# Patient Record
Sex: Male | Born: 1965 | Race: Black or African American | Hispanic: No | Marital: Married | Smoking: Former smoker
Health system: Southern US, Community
[De-identification: ages and names within clinical notes are randomized; demographics above are authoritative.]

## PROBLEM LIST (undated history)

## (undated) DIAGNOSIS — U071 COVID-19: Secondary | ICD-10-CM

## (undated) DIAGNOSIS — I10 Essential (primary) hypertension: Secondary | ICD-10-CM

## (undated) HISTORY — PX: ABDOMINAL SURGERY: SHX537

---

## 2007-07-20 ENCOUNTER — Encounter (INDEPENDENT_AMBULATORY_CARE_PROVIDER_SITE_OTHER): Payer: Self-pay | Admitting: General Surgery

## 2007-07-20 ENCOUNTER — Inpatient Hospital Stay (HOSPITAL_COMMUNITY): Admission: EM | Admit: 2007-07-20 | Discharge: 2007-07-21 | Payer: Self-pay | Admitting: Emergency Medicine

## 2007-08-12 ENCOUNTER — Emergency Department (HOSPITAL_COMMUNITY): Admission: EM | Admit: 2007-08-12 | Discharge: 2007-08-12 | Payer: Self-pay | Admitting: Emergency Medicine

## 2007-08-14 ENCOUNTER — Emergency Department (HOSPITAL_COMMUNITY): Admission: EM | Admit: 2007-08-14 | Discharge: 2007-08-14 | Payer: Self-pay | Admitting: Emergency Medicine

## 2010-07-19 NOTE — H&P (Signed)
NAMEDaron Offer                ACCOUNT NO.:  [id number]   MEDICAL RECORD NO.:  [id number]          PATIENT TYPE:  EMS   LOCATION:  MAJO                         FACILITY:  [facility]   PHYSICIAN:  Lennie Muckle, MD      DATE OF BIRTH:  08-Jan-1966   DATE OF ADMISSION:  07/20/2007  DATE OF DISCHARGE:                              HISTORY & PHYSICAL   REASON FOR ADMISSION:  Appendicitis.   HISTORY OF PRESENT ILLNESS:  [name] is a 45 year old male who had  onset of abdominal pain this morning.  It was located in the umbilical  region as well as the right lower quadrant.  It did worsen with  movement.  He did not have associated nausea and emesis.  No chills.  No  diarrhea.  He had episodes of diarrhea previously but treated  aggressively with medical therapy.  A CT scan was performed, which  revealed a thick and dense, small amount of fluid in the pelvis.   PAST MEDICAL HISTORY:  He has no past medical history.   SURGICAL HISTORY:  Negative.   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  No alcohol use.   FAMILY HISTORY:  Negative.   ALLERGIES:  No drug allergies.   REVIEW OF SYSTEMS:  Negative.   PHYSICAL EXAMINATION:  VITAL SIGNS:  Temperature is 96.3, pulse is 63,  and blood pressure 101/56.  GENERAL:  He overall is a well-developed, well-nourished male in no  acute distress, lying on stretcher.  HEENT:  Extraocular muscles are intact. Oral mucosa is moist.  CHEST: Clear to auscultation bilaterally.  CARDIOVASCULAR: Regular rate and rhythm.  ABDOMEN:  Mildly distended to palpation of the right lower quadrant.  Positive Rovsing sound.  He does have umbilical hernia, easily  reducible.  EXTREMITIES:  No deformity or edema.  SKIN:  Normal limits.  NEUROLOGICAL:  Cranial nerves II through XII are grossly intact.  No  focal deficits.   LABORATORY DATA:  White count is slightly elevated at 12.5 and  hemoglobin/hematocrit 15 and  45.  Serum chemistry, sodium is mildly low  at 3.4.  Other labs are essentially normal.   ASSESSMENT/PLAN:  Appendicitis.   Plan is laparoscopic appendectomy with IV antibiotics.  If everything  goes well, and there is no purulent fluid noted, he will be likely be  able to go home tomorrow.      Lennie Muckle, MD  Electronically Signed     ALA/MEDQ  D:  07/20/2007  T:  07/21/2007  Job:  [id number]

## 2010-07-19 NOTE — Op Note (Signed)
NAMEDaron Lawrence                ACCOUNT NO.:  [id number]   MEDICAL RECORD NO.:  [id number]          PATIENT TYPE:  INP   LOCATION:  5123                         FACILITY:  [facility]   PHYSICIAN:  Lennie Muckle, MD      DATE OF BIRTH:  1965-05-07   DATE OF PROCEDURE:  07/20/2007  DATE OF DISCHARGE:                               OPERATIVE REPORT   PREOPERATIVE DIAGNOSIS:  Appendicitis.   POSTOPERATIVE DIAGNOSIS:  Appendicitis.   PROCEDURE:  Laparoscopic appendectomy.   SURGEON:  Lennie Muckle, MD   ASSISTANT:  None.   ANESTHESIA:  General endotracheal.   FINDINGS:  Small amount of purulent fluid in the pelvis with  appendicitis.  Appendix was sent to pathology for review.   ESTIMATED BLOOD LOSS:  Minimal.   COMPLICATIONS:  No.   DRAINS:  None.   INDICATIONS FOR PROCEDURE:  [name] is a 45 year old male who had onset  of abdominal pain earlier in the morning who came into the emergency  department due to emesis and abdominal pain.  CT scan showed an enlarged  inflamed appendix.  Informed consent was obtained for laparoscopic  appendectomy, possible open.   DETAIL OF THE PROCEDURE:  [name] was treated with Zosyn  preoperatively.  He was taken to the operating room, placed in supine  position.  After administration of the general endotracheal anesthesia,  his abdomen was prepped and draped in the usual sterile fashion.  Time  out procedure indicating the patient had procedure performed.  An  umbilical incision was placed in #11 blade in the hernia site.  A 5-mm  trocar was placed in OptiView directly into the abdominal cavity.  No  evidence of injury upon placement of the trocar.  Another 5-mm trocar  was placed in the right upper quadrant.  A 12-mm trocar was placed in  the left lower quadrant.  There was a minimal amount of purulent fluid  noted in the pelvis.  I was able to grasp the cecum in the right lower  quadrant, dissect laterally the attachments of the side  walls with  scissors as well as the Mailler forceps and harmonic scalpel.  Dissected  fully around the base of the appendix.  I did mobilize part of the cecum  and the small intestine in order to able to fully mobilize the appendix.  A laparoscopic stapler was placed across the base of appendix using the  blue load.  The mesoappendix was divided with the harmonic scalpel.  The  appendix was placed in the Endocatch bag and removed from the abdomen.  The abdomen was irrigated with approximately 2 liters of saline.  Aspirate appeared clear on final irrigation. There was a minimal amount  of oozing at the staple line which seemed to have stopped at last look.  I elected to place a layer of Tisseel over the staple line.  After  closing the 12-mm port site with 0-Vicryl suture, I turned my attention  to the umbilical site.  The incision was enlarged with the scalpel.  I  created skin flaps using the electrocautery.  The fascia was also  dissected with the electrocautery in order to facilitate closure. I  closed this in an interrupted fashion using 1-0 Prolene suture.  The  closure was inspected intra-abdominally with no adhesions to the  closure.  The pneumoperitoneum was then released.  The skin was closed  with 4-0 Monocryl, and Dermabond was placed with final dressing.  The  patient has been given a local block in the operating room by  anesthesia.  He was then extubated and transported to the postanesthesia  care unit in stable condition.  He will be monitored overnight, given 2  doses of Zosyn and then likely be able to be discharged home tomorrow.      Lennie Muckle, MD  Electronically Signed     ALA/MEDQ  D:  07/20/2007  T:  07/21/2007  Job:  [id number]

## 2010-11-08 ENCOUNTER — Emergency Department (HOSPITAL_COMMUNITY)
Admission: EM | Admit: 2010-11-08 | Discharge: 2010-11-08 | Disposition: A | Discharge status: Home / Self Care | Payer: BC Managed Care – PPO | Attending: Emergency Medicine | Admitting: Emergency Medicine

## 2010-11-08 DIAGNOSIS — X58XXXA Exposure to other specified factors, initial encounter: Secondary | ICD-10-CM | POA: Insufficient documentation

## 2010-11-08 DIAGNOSIS — S058X9A Other injuries of unspecified eye and orbit, initial encounter: Secondary | ICD-10-CM | POA: Insufficient documentation

## 2010-11-30 LAB — DIFFERENTIAL
Basophils Absolute: 0.1
Lymphocytes Relative: 6 — ABNORMAL LOW
Neutro Abs: 11.2 — ABNORMAL HIGH

## 2010-11-30 LAB — URINALYSIS, ROUTINE W REFLEX MICROSCOPIC
Glucose, UA: NEGATIVE
Hgb urine dipstick: NEGATIVE
Protein, ur: NEGATIVE

## 2010-11-30 LAB — COMPREHENSIVE METABOLIC PANEL
BUN: 10
CO2: 27
Chloride: 104
Creatinine, Ser: 0.99
GFR calc non Af Amer: >60
Total Bilirubin: 1.7 — ABNORMAL HIGH

## 2010-11-30 LAB — CBC
HCT: 45.9
MCV: 84.4
RBC: 5.43
WBC: 12.5 — ABNORMAL HIGH

## 2010-11-30 LAB — LIPASE, BLOOD: Lipase: 15

## 2011-05-02 ENCOUNTER — Emergency Department (HOSPITAL_COMMUNITY): Payer: BC Managed Care – PPO

## 2011-05-02 ENCOUNTER — Encounter (HOSPITAL_COMMUNITY): Payer: Self-pay | Admitting: *Deleted

## 2011-05-02 ENCOUNTER — Emergency Department (HOSPITAL_COMMUNITY)
Admission: EM | Admit: 2011-05-02 | Discharge: 2011-05-02 | Disposition: A | Discharge status: Home / Self Care | Payer: BC Managed Care – PPO | Attending: Emergency Medicine | Admitting: Emergency Medicine

## 2011-05-02 DIAGNOSIS — S0990XA Unspecified injury of head, initial encounter: Secondary | ICD-10-CM | POA: Insufficient documentation

## 2011-05-02 DIAGNOSIS — W208XXA Other cause of strike by thrown, projected or falling object, initial encounter: Secondary | ICD-10-CM | POA: Insufficient documentation

## 2011-05-02 DIAGNOSIS — M542 Cervicalgia: Secondary | ICD-10-CM | POA: Insufficient documentation

## 2011-05-02 DIAGNOSIS — Y9289 Other specified places as the place of occurrence of the external cause: Secondary | ICD-10-CM | POA: Insufficient documentation

## 2011-05-02 DIAGNOSIS — S0003XA Contusion of scalp, initial encounter: Secondary | ICD-10-CM | POA: Insufficient documentation

## 2011-05-02 DIAGNOSIS — S1093XA Contusion of unspecified part of neck, initial encounter: Secondary | ICD-10-CM | POA: Insufficient documentation

## 2011-05-02 MED ORDER — TETANUS-DIPHTH-ACELL PERTUSSIS 5-2.5-18.5 LF-MCG/0.5 IM SUSP
0.5000 mL | Freq: Once | INTRAMUSCULAR | Status: AC
  Administered 2011-05-02: 0.5 mL via INTRAMUSCULAR
  Filled 2011-05-02: qty 0.5

## 2011-05-02 MED ORDER — ONDANSETRON 4 MG PO TBDP
8.0000 mg | ORAL_TABLET | Freq: Once | ORAL | Status: AC
  Administered 2011-05-02: 8 mg via ORAL
  Filled 2011-05-02: qty 2

## 2011-05-02 MED ORDER — IBUPROFEN 800 MG PO TABS: 800.0000 mg | ORAL_TABLET | Freq: Three times a day (TID) | ORAL | Status: AC

## 2011-05-02 NOTE — ED Provider Notes (Signed)
History     CSN: [id number]  Arrival date & time 05/02/11  0124   First MD Initiated Contact with Patient 05/02/11 [phone]      Chief Complaint  Patient presents with  . Head Injury    (Consider location/radiation/quality/duration/timing/severity/associated sxs/prior treatment) Patient is a 46 y.o. male presenting with head injury. The history is provided by the patient.  Head Injury  The incident occurred less than 1 hour ago. He came to the ER via walk-in. The injury mechanism was a direct blow. There was no loss of consciousness. The volume of blood lost was minimal. The quality of the pain is described as sharp. The pain is moderate. The pain has been constant since the injury. Pertinent negatives include no numbness, no blurred vision, no vomiting, no tinnitus, no disorientation, no weakness and no memory loss. He has tried nothing for the symptoms.   at work tonight and a heavy object fell on top of his head causing pain and swelling and a small abrasion. Location left superior posterior scalp. He has some associated mild neck discomfort. He denies any weakness or numbness or injury otherwise. He has had some nausea but no vomiting. He declines any pain medications.  History reviewed. No pertinent past medical history.  Past Surgical History  Procedure Date  . Abdominal surgery     No family history on file.  History  Substance Use Topics  . Smoking status: Never Smoker   . Smokeless tobacco: Not on file  . Alcohol Use: No      Review of Systems  Constitutional: Negative for fever and chills.  HENT: Negative for neck pain, neck stiffness and tinnitus.   Eyes: Negative for blurred vision and pain.  Respiratory: Negative for shortness of breath.   Cardiovascular: Negative for chest pain.  Gastrointestinal: Negative for vomiting and abdominal pain.  Genitourinary: Negative for dysuria.  Musculoskeletal: Negative for back pain.  Skin: Positive for wound. Negative for  rash.  Neurological: Positive for headaches. Negative for weakness and numbness.  Psychiatric/Behavioral: Negative for memory loss.  All other systems reviewed and are negative.    Allergies  Review of patient's allergies indicates no known allergies.  Home Medications   Current Outpatient Rx  Name Route Sig Dispense Refill  . ACETAMINOPHEN 500 MG PO TABS Oral Take 1,000 mg by mouth every 6 (six) hours as needed. For pain or fever      BP 111/77  Pulse 71  Temp(Src) 98.1 F (36.7 C) (Oral)  Resp 16  SpO2 98%  Physical Exam  Constitutional: He is oriented to person, place, and time. He appears well-developed and well-nourished.  HENT:  Head: Normocephalic.       Left superior posterior scalp with abrasion, tenderness and area of swelling. No deep laceration. Bleeding controlled.  Eyes: Conjunctivae and EOM are normal. Pupils are equal, round, and reactive to light.  Neck: Trachea normal. Neck supple. No thyromegaly present.       Very mild cervical discomfort without bony deformity or step off. No associated upper extremity deficits with equal grip strengths and sensorium to light touch throughout.  Cardiovascular: Normal rate, regular rhythm, S1 normal, S2 normal and normal pulses.     No systolic murmur is present   No diastolic murmur is present  Pulses:      Radial pulses are 2+ on the right side, and 2+ on the left side.  Pulmonary/Chest: Effort normal and breath sounds normal. He has no wheezes. He has no rhonchi.  He has no rales. He exhibits no tenderness.  Abdominal: Soft. Normal appearance and bowel sounds are normal. There is no tenderness. There is no CVA tenderness and negative Murphy's sign.  Musculoskeletal:       No motor or sensory deficits  Neurological: He is alert and oriented to person, place, and time. He has normal strength. No cranial nerve deficit or sensory deficit. GCS eye subscore is 4. GCS verbal subscore is 5. GCS motor subscore is 6.  Skin: Skin  is warm and dry. No rash noted. He is not diaphoretic.  Psychiatric: His speech is normal.       Cooperative and appropriate    ED Course  Procedures (including critical care time)  Labs Reviewed - No data to display Ct Head Wo Contrast  05/02/2011  *RADIOLOGY REPORT*  Clinical Data:  Status post head injury; laceration to the top of the head on the left side.  Head and neck pain.  CT HEAD WITHOUT CONTRAST AND CT CERVICAL SPINE WITHOUT CONTRAST  Technique:  Multidetector CT imaging of the head and cervical spine was performed following the standard protocol without intravenous contrast.  Multiplanar CT image reconstructions of the cervical spine were also generated.  Comparison: None  CT HEAD  Findings: There is no evidence of acute infarction, mass lesion, or intra- or extra-axial hemorrhage on CT.  The posterior fossa, including the cerebellum, brainstem and fourth ventricle, is within normal limits.  The third and lateral ventricles, and basal ganglia are unremarkable in appearance.  The cerebral hemispheres are symmetric in appearance, with normal gray- white differentiation.  No mass effect or midline shift is seen.  There is no evidence of fracture; visualized osseous structures are unremarkable in appearance.  The visualized portions of the orbits are within normal limits.  There is mild partial opacification of the left maxillary sinus.  The remaining paranasal sinuses and mastoid air cells are well-aerated.  No significant soft tissue abnormalities are seen; the known soft tissue laceration is not well characterized.  IMPRESSION:  1.  No evidence of traumatic intracranial injury or fracture. 2.  Mild partial opacification of the left maxillary sinus.  CT CERVICAL SPINE  Findings: There is no evidence of fracture or subluxation. Vertebral bodies demonstrate normal height and alignment. Intervertebral disc spaces are preserved.  Prevertebral soft tissues are within normal limits.  The visualized  neural foramina are grossly unremarkable.  The thyroid gland is unremarkable in appearance.  The visualized lung apices are clear.  No significant soft tissue abnormalities are seen.  IMPRESSION: No evidence of fracture or subluxation along the cervical spine.  Original Report Authenticated By: Tonia Ghent, M.D.   Ct Cervical Spine Wo Contrast  05/02/2011  *RADIOLOGY REPORT*  Clinical Data:  Status post head injury; laceration to the top of the head on the left side.  Head and neck pain.  CT HEAD WITHOUT CONTRAST AND CT CERVICAL SPINE WITHOUT CONTRAST  Technique:  Multidetector CT imaging of the head and cervical spine was performed following the standard protocol without intravenous contrast.  Multiplanar CT image reconstructions of the cervical spine were also generated.  Comparison: None  CT HEAD  Findings: There is no evidence of acute infarction, mass lesion, or intra- or extra-axial hemorrhage on CT.  The posterior fossa, including the cerebellum, brainstem and fourth ventricle, is within normal limits.  The third and lateral ventricles, and basal ganglia are unremarkable in appearance.  The cerebral hemispheres are symmetric in appearance, with normal gray- white differentiation.  No mass effect or midline shift is seen.  There is no evidence of fracture; visualized osseous structures are unremarkable in appearance.  The visualized portions of the orbits are within normal limits.  There is mild partial opacification of the left maxillary sinus.  The remaining paranasal sinuses and mastoid air cells are well-aerated.  No significant soft tissue abnormalities are seen; the known soft tissue laceration is not well characterized.  IMPRESSION:  1.  No evidence of traumatic intracranial injury or fracture. 2.  Mild partial opacification of the left maxillary sinus.  CT CERVICAL SPINE  Findings: There is no evidence of fracture or subluxation. Vertebral bodies demonstrate normal height and alignment.  Intervertebral disc spaces are preserved.  Prevertebral soft tissues are within normal limits.  The visualized neural foramina are grossly unremarkable.  The thyroid gland is unremarkable in appearance.  The visualized lung apices are clear.  No significant soft tissue abnormalities are seen.  IMPRESSION: No evidence of fracture or subluxation along the cervical spine.  Original Report Authenticated By: Tonia Ghent, M.D.    Scalp contusion and abrasion  Wound care, tetanus updated, Zofran for nausea improves symptoms. Patient declines pain meds  CT scans change and reviewed as above.    MDM   Head injury with abrasion and contusion. Wound is not require sutures or staples. Occupational health referral as needed. Patient cleared to return to work.        Sunnie Nielsen, MD 05/02/11 [phone]

## 2011-05-02 NOTE — Discharge Instructions (Signed)
Apply Neosporin ointment to scalp wound twice daily for the next 5 days. Take ibuprofen as needed. 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  Followup with Worker's Comp. as needed and return here for any worsening condition.   A bruise (contusion) or hematoma is a collection of blood under skin causing an area of discoloration. It is caused by an injury to blood vessels beneath the injured area with a release of blood into that area. As blood accumulates it is known as a hematoma. This collection of blood causes a blue to dark blue color. As the injury improves over days to weeks it turns to a yellowish color and then usually disappears completely over the same period of time. These generally resolve completely without problems. The hematoma rarely requires drainage. HOME CARE INSTRUCTIONS   Apply ice to the injured area for 15 to 20 minutes 3 to 4 times per day for the first 1 or 2 days.   Put the ice in a plastic bag and place a towel between the bag of ice and your skin. Discontinue the ice if it causes pain.   If bleeding is more than just a little, apply pressure to the area for at least thirty minutes to decrease the amount of bruising. Apply pressure and ice as your caregiver suggests.   If the injury is on an extremity, elevation of that part may help to decrease pain and swelling. Wrapping with an ace or supportive wrap may also be helpful. If the bruise is on a lower extremity and is painful, crutches may be helpful for a couple days.   If you have been given a tetanus shot because the skin was broken, your arm may get swollen, red and warm to touch at the shot site. This is a normal response to the medicine in the shot. If you did not receive a tetanus shot today because you did not recall when your last one was given, make sure to check with your caregiver's office and determine if one is needed. Generally for a "dirty" wound, you should receive a tetanus booster if you have not had one in the last  five years. If you have a "clean" wound, you should receive a tetanus booster if you have not had one within the last ten years.  SEEK MEDICAL CARE IF:   You have pain not controlled with over the counter medications. Only take over-the-counter or prescription medicines for pain, discomfort, or fever as directed by your caregiver. Do not use aspirin as it may cause bleeding.   You develop increasing pain or swelling in the area of injury.   You develop any problems which seem worse than the problems which brought you in.  SEEK IMMEDIATE MEDICAL CARE IF:   You have a fever.   You develop severe pain in the area of the bruise out of proportion to the initial injury.   The bruised area becomes red, tender, and swollen.  MAKE SURE YOU:   Understand these instructions.   Will watch your condition.   Will get help right away if you are not doing well or get worse.  Document Released: 11/30/2004 Document Revised: 11/02/2010 Document Reviewed: 10/09/2007 [facility] Patient Information 2012 [place], [place].

## 2011-05-02 NOTE — ED Notes (Addendum)
Here after head injury at work, here from work, hit in head with metal around ~ 2215, puncture/lac noted to L parietal head, ~ 0.5 cm, bleeding controlled, "felt dizzy and nauseated initially", c/o HA & "stomach bothering me/upset since incident". Denies LOC or vomiting.

## 2011-05-02 NOTE — ED Notes (Signed)
Cleansed wound to left upper head with wound cleaner and applied bactracin onitment

## 2012-02-13 ENCOUNTER — Emergency Department (HOSPITAL_COMMUNITY)
Admission: EM | Admit: 2012-02-13 | Discharge: 2012-02-13 | Disposition: A | Discharge status: Home / Self Care | Payer: BC Managed Care – PPO | Attending: Emergency Medicine | Admitting: Emergency Medicine

## 2012-02-13 ENCOUNTER — Emergency Department (HOSPITAL_COMMUNITY): Payer: BC Managed Care – PPO

## 2012-02-13 ENCOUNTER — Encounter (HOSPITAL_COMMUNITY): Payer: Self-pay | Admitting: *Deleted

## 2012-02-13 DIAGNOSIS — R05 Cough: Secondary | ICD-10-CM | POA: Insufficient documentation

## 2012-02-13 DIAGNOSIS — J159 Unspecified bacterial pneumonia: Secondary | ICD-10-CM | POA: Insufficient documentation

## 2012-02-13 DIAGNOSIS — R059 Cough, unspecified: Secondary | ICD-10-CM | POA: Insufficient documentation

## 2012-02-13 DIAGNOSIS — J189 Pneumonia, unspecified organism: Secondary | ICD-10-CM

## 2012-02-13 DIAGNOSIS — R0789 Other chest pain: Secondary | ICD-10-CM | POA: Insufficient documentation

## 2012-02-13 DIAGNOSIS — R110 Nausea: Secondary | ICD-10-CM | POA: Insufficient documentation

## 2012-02-13 DIAGNOSIS — J3489 Other specified disorders of nose and nasal sinuses: Secondary | ICD-10-CM | POA: Insufficient documentation

## 2012-02-13 LAB — CBC WITH DIFFERENTIAL/PLATELET
Basophils Relative: 0 % (ref 0–1)
Hemoglobin: 14.7 g/dL (ref 13.0–17.0)
Lymphs Abs: 0.9 10*3/uL (ref 0.7–4.0)
MCHC: 35.5 g/dL (ref 30.0–36.0)
Monocytes Relative: 11 % (ref 3–12)
Neutro Abs: 6.6 10*3/uL (ref 1.7–7.7)
Neutrophils Relative %: 78 % — ABNORMAL HIGH (ref 43–77)
RBC: 5.16 MIL/uL (ref 4.22–5.81)

## 2012-02-13 LAB — COMPREHENSIVE METABOLIC PANEL
ALT: 80 U/L — ABNORMAL HIGH (ref 0–53)
AST: 52 U/L — ABNORMAL HIGH (ref 0–37)
Albumin: 3.9 g/dL (ref 3.5–5.2)
Alkaline Phosphatase: 69 U/L (ref 39–117)
BUN: 13 mg/dL (ref 6–23)
CO2: 23 mEq/L (ref 19–32)
Calcium: 9.2 mg/dL (ref 8.4–10.5)
Chloride: 95 mEq/L — ABNORMAL LOW (ref 96–112)
Creatinine, Ser: 0.87 mg/dL (ref 0.50–1.35)
GFR calc Af Amer: >90 mL/min (ref >90)
GFR calc non Af Amer: >90 mL/min (ref >90)
Glucose, Bld: 110 mg/dL — ABNORMAL HIGH (ref 70–99)
Potassium: 3.6 mEq/L (ref 3.5–5.1)
Sodium: 131 mEq/L — ABNORMAL LOW (ref 135–145)
Total Bilirubin: 0.9 mg/dL (ref 0.3–1.2)
Total Protein: 8.1 g/dL (ref 6.0–8.3)

## 2012-02-13 LAB — LIPASE, BLOOD: Lipase: 131 U/L — ABNORMAL HIGH (ref 11–59)

## 2012-02-13 MED ORDER — IOHEXOL 300 MG/ML  SOLN
100.0000 mL | Freq: Once | INTRAMUSCULAR | Status: AC | PRN
  Administered 2012-02-13: 100 mL via INTRAVENOUS

## 2012-02-13 MED ORDER — ACETAMINOPHEN 325 MG PO TABS
650.0000 mg | ORAL_TABLET | Freq: Once | ORAL | Status: AC
  Administered 2012-02-13: 650 mg via ORAL
  Filled 2012-02-13: qty 2

## 2012-02-13 MED ORDER — MORPHINE SULFATE 4 MG/ML IJ SOLN
4.0000 mg | Freq: Once | INTRAMUSCULAR | Status: AC
  Administered 2012-02-13: 4 mg via INTRAVENOUS
  Filled 2012-02-13: qty 1

## 2012-02-13 MED ORDER — ACETAMINOPHEN 325 MG PO TABS
ORAL_TABLET | ORAL | Status: AC
  Filled 2012-02-13: qty 2

## 2012-02-13 MED ORDER — DIPHENHYDRAMINE HCL 50 MG/ML IJ SOLN
25.0000 mg | Freq: Once | INTRAMUSCULAR | Status: AC
  Administered 2012-02-13: 15:00:00 via INTRAVENOUS
  Filled 2012-02-13: qty 1

## 2012-02-13 MED ORDER — LEVOFLOXACIN 500 MG PO TABS: 750.0000 mg | ORAL_TABLET | Freq: Every day | ORAL | Status: DC

## 2012-02-13 MED ORDER — METOCLOPRAMIDE HCL 5 MG/ML IJ SOLN
10.0000 mg | Freq: Once | INTRAMUSCULAR | Status: AC
  Administered 2012-02-13: 10 mg via INTRAVENOUS
  Filled 2012-02-13: qty 2

## 2012-02-13 MED ORDER — SODIUM CHLORIDE 0.9 % IV BOLUS (SEPSIS)
1000.0000 mL | Freq: Once | INTRAVENOUS | Status: AC
  Administered 2012-02-13: 1000 mL via INTRAVENOUS

## 2012-02-13 MED ORDER — ONDANSETRON 4 MG PO TBDP: 4.0000 mg | ORAL_TABLET | Freq: Three times a day (TID) | ORAL | Status: DC | PRN

## 2012-02-13 NOTE — ED Notes (Signed)
Pt c/o flu like symptoms, reports started on yesterday. Reports abd pain and nausea since yesterday. Also reports chest pain sharp in nature that began on yesterday. Pt also has cough.

## 2012-02-13 NOTE — ED Notes (Signed)
Bed:WA10<BR> Expected date:<BR> Expected time:<BR> Means of arrival:<BR> Comments:<BR> Hold for triage 4

## 2012-02-13 NOTE — ED Provider Notes (Signed)
History     CSN: [id number]  Arrival date & time 02/13/12  1154   First MD Initiated Contact with Patient 02/13/12 1342      Chief Complaint  Patient presents with  . Abdominal Pain  . Chest Pain    (Consider location/radiation/quality/duration/timing/severity/associated sxs/prior treatment) HPI Comments: Patient is a 46 year old male who presents with a cough and upper respiratory symptoms that started yesterday. Patient reports gradual onset of symptoms with progressive worsening. Patient reports dry cough, nasal congestion, headache, and nausea. Patient reports associated sharp chest pain when he coughs. The chest pain is located in his generalized chest without radiation. Patient has tried tylenol for symptoms without relief. Patient denies alleviating/aggravating factors. Patient denies any other symptoms.    History reviewed. No pertinent past medical history.  Past Surgical History  Procedure Date  . Abdominal surgery     History reviewed. No pertinent family history.  History  Substance Use Topics  . Smoking status: Never Smoker   . Smokeless tobacco: Not on file  . Alcohol Use: No      Review of Systems  Respiratory: Positive for cough.   Cardiovascular: Positive for chest pain.  Gastrointestinal: Positive for nausea and abdominal pain.  All other systems reviewed and are negative.    Allergies  Review of patient's allergies indicates no known allergies.  Home Medications   Current Outpatient Rx  Name  Route  Sig  Dispense  Refill  . ACETAMINOPHEN 500 MG PO TABS   Oral   Take 1,000 mg by mouth every 6 (six) hours as needed. For pain or fever           BP 147/88  Pulse 100  Temp 100 F (37.8 C) (Oral)  Resp 22  Wt 242 lb 12.8 oz (110.133 kg)  SpO2 95%  Physical Exam  Nursing note and vitals reviewed. Constitutional: He is oriented to person, place, and time. He appears well-developed and well-nourished. No distress.  HENT:  Head:  Normocephalic and atraumatic.  Mouth/Throat: Oropharynx is clear and moist. No oropharyngeal exudate.  Eyes: Conjunctivae normal and EOM are normal.  Neck: Normal range of motion. Neck supple.  Cardiovascular: Normal rate and regular rhythm.  Exam reveals no gallop and no friction rub.   No murmur heard. Pulmonary/Chest: Effort normal and breath sounds normal. He has no wheezes. He has no rales. He exhibits no tenderness.  Abdominal: Soft. He exhibits no distension. There is tenderness. There is no rebound and no guarding.       Generalized tenderness to palpation. No focal area of tenderness and no suprapubic tenderness.   Musculoskeletal: Normal range of motion.  Neurological: He is alert and oriented to person, place, and time. Coordination normal.       Speech is goal-oriented. Moves limbs without ataxia.   Skin: Skin is warm and dry. He is not diaphoretic.  Psychiatric: He has a normal mood and affect. His behavior is normal.    ED Course  Procedures (including critical care time)  Labs Reviewed  CBC WITH DIFFERENTIAL - Abnormal; Notable for the following:    Neutrophils Relative 78 (*)     Lymphocytes Relative 11 (*)     All other components within normal limits  COMPREHENSIVE METABOLIC PANEL - Abnormal; Notable for the following:    Sodium 131 (*)     Chloride 95 (*)     Glucose, Bld 110 (*)     AST 52 (*)     ALT 80 (*)  All other components within normal limits  LIPASE, BLOOD - Abnormal; Notable for the following:    Lipase 131 (*)     All other components within normal limits  TROPONIN I   Dg Chest 2 View  02/13/2012  *RADIOLOGY REPORT*  Clinical Data: Cough, chest pain  CHEST - 2 VIEW  Comparison: None.  Findings:  Normal cardiac silhouette and mediastinal contours.  There is diffuse, slightly nodular wall thickening of the pulmonary interstitium.  Minimal left basilar opacities favored to represent atelectasis.  There is mild elevation of the right hemidiaphragm.  No pleural effusion or pneumothorax. No acute osseous abnormalities.  IMPRESSION: Mild diffuse, slightly nodular thickening of the pulmonary interstitium most suggestive of airways disease, though note, atypical infection may have a similar appearance.  A follow-up chest radiograph in 4 to 6 weeks after treatment is recommended to ensure resolution.   Original Report Authenticated By: Tacey Ruiz, MD      1. CAP (community acquired pneumonia)       MDM  3:53 PM Patient's chest xray concerning for pneumonia. Labs show elevated lipase and liver enzymes. Patient having generalized abdominal tenderness. He will have CT abdomen pelvis and morphine for pain.   6:24 PM CT unremarkable. Patient will be treated for CAP. I will discharge him with levaquin. Patient is afebrile and non toxic. Patient instructed to return with worsening or concerning symptoms. No further evaluation needed at this time.      Emilia Beck, PA-C 02/13/12 1850

## 2012-02-14 NOTE — ED Provider Notes (Signed)
Medical screening examination/treatment/procedure(s) were performed by non-physician practitioner and as supervising physician I was immediately available for consultation/collaboration.  Marwan T Powers, MD 02/14/12 0719 

## 2013-01-06 ENCOUNTER — Emergency Department (HOSPITAL_COMMUNITY)
Admission: EM | Admit: 2013-01-06 | Discharge: 2013-01-07 | Disposition: A | Discharge status: Home / Self Care | Payer: BC Managed Care – PPO | Attending: Emergency Medicine | Admitting: Emergency Medicine

## 2013-01-06 DIAGNOSIS — Z79899 Other long term (current) drug therapy: Secondary | ICD-10-CM | POA: Insufficient documentation

## 2013-01-06 DIAGNOSIS — J069 Acute upper respiratory infection, unspecified: Secondary | ICD-10-CM | POA: Insufficient documentation

## 2013-01-06 DIAGNOSIS — R51 Headache: Secondary | ICD-10-CM | POA: Insufficient documentation

## 2013-01-06 DIAGNOSIS — R05 Cough: Secondary | ICD-10-CM

## 2013-01-06 DIAGNOSIS — Z8701 Personal history of pneumonia (recurrent): Secondary | ICD-10-CM | POA: Insufficient documentation

## 2013-01-06 DIAGNOSIS — J984 Other disorders of lung: Secondary | ICD-10-CM | POA: Insufficient documentation

## 2013-01-07 ENCOUNTER — Emergency Department (HOSPITAL_COMMUNITY): Payer: BC Managed Care – PPO

## 2013-01-07 ENCOUNTER — Encounter (HOSPITAL_COMMUNITY): Payer: Self-pay | Admitting: Emergency Medicine

## 2013-01-07 MED ORDER — GUAIFENESIN 100 MG/5ML PO SOLN: 5.0000 mL | Freq: Four times a day (QID) | ORAL | Status: DC | PRN

## 2013-01-07 MED ORDER — PSEUDOEPHEDRINE HCL ER 120 MG PO TB12
120.0000 mg | ORAL_TABLET | ORAL | Status: AC
  Administered 2013-01-07: 120 mg via ORAL
  Filled 2013-01-07: qty 1

## 2013-01-07 MED ORDER — PSEUDOEPHEDRINE HCL ER 120 MG PO TB12: 120.0000 mg | ORAL_TABLET | Freq: Two times a day (BID) | ORAL | Status: DC

## 2013-01-07 MED ORDER — GUAIFENESIN 100 MG/5ML PO SOLN
5.0000 mL | ORAL | Status: AC
  Administered 2013-01-07: 100 mg via ORAL
  Filled 2013-01-07: qty 5

## 2013-01-07 NOTE — ED Notes (Signed)
Pt. States that he has had a headache everyday when he wakes up for the last 6 months. He states that the nasal congestion, runny nose, sneezing and coughing started 3 weeks ago. The only med he takes is ibuprofen.

## 2013-01-07 NOTE — ED Notes (Signed)
Pt. reports nasal congestion , runny nose , sneezing with occasional productive cough and persistent headache .

## 2013-01-07 NOTE — ED Provider Notes (Signed)
Medical screening examination/treatment/procedure(s) were performed by non-physician practitioner and as supervising physician I was immediately available for consultation/collaboration.    Adreyan Carbajal M Jaquann Guarisco, MD 01/07/13 0339 

## 2013-01-07 NOTE — ED Provider Notes (Signed)
CSN: [id number]     Arrival date & time 01/06/13  2356 History   First MD Initiated Contact with Patient 01/07/13 0009     Chief Complaint  Patient presents with  . Nasal Congestion  . Cough  . Headache   (Consider location/radiation/quality/duration/timing/severity/associated sxs/prior Treatment) HPI Comments: Patient reports, that for the past 6 months, when he wakes in the morning.  Has a, headache.  That's relieved by ibuprofen for the past 3, weeks.  He's had sinus congestion, runny nose.  Sneezing, coughing, occasionally bringing up clear mucus.  Denies fever, or chills, states he's been taking ibuprofen, without much relief. Has a history of pneumonia, December 2013  Patient is a 47 y.o. male presenting with cough and headaches. The history is provided by the patient.  Cough Cough characteristics:  Non-productive Severity:  Mild Onset quality:  Gradual Duration:  3 weeks Timing:  Intermittent Progression:  Unchanged Chronicity:  New Smoker: no   Relieved by:  None tried Worsened by:  Deep breathing Ineffective treatments:  None tried Associated symptoms: headaches, rhinorrhea and sinus congestion   Associated symptoms: no chest pain, no chills, no ear pain, no eye discharge, no fever, no rash, no shortness of breath, no sore throat and no wheezing   Headaches:    Severity:  Mild   Onset quality:  Unable to specify   Duration:  6 months   Timing:  Intermittent   Progression:  Unchanged   Chronicity:  New Rhinorrhea:    Quality:  Clear   Severity:  Moderate   Duration:  3 weeks   Timing:  Intermittent   Progression:  Unchanged Risk factors: no chemical exposure, no recent infection and no recent travel   Headache Associated symptoms: cough and sinus pressure   Associated symptoms: no dizziness, no ear pain, no fever and no sore throat     History reviewed. No pertinent past medical history. Past Surgical History  Procedure Laterality Date  . Abdominal surgery      No family history on file. History  Substance Use Topics  . Smoking status: Never Smoker   . Smokeless tobacco: Not on file  . Alcohol Use: No    Review of Systems  Constitutional: Negative for fever and chills.  HENT: Positive for rhinorrhea and sinus pressure. Negative for ear pain, facial swelling and sore throat.   Eyes: Negative for discharge.  Respiratory: Positive for cough. Negative for shortness of breath and wheezing.   Cardiovascular: Negative for chest pain.  Skin: Negative for rash and wound.  Neurological: Positive for headaches. Negative for dizziness.    Allergies  Review of patient's allergies indicates no known allergies.  Home Medications   Current Outpatient Rx  Name  Route  Sig  Dispense  Refill  . acetaminophen (TYLENOL) 500 MG tablet   Oral   Take 1,000 mg by mouth every 6 (six) hours as needed. For pain or fever         . guaiFENesin (ROBITUSSIN) 100 MG/5ML SOLN   Oral   Take 5 mLs (100 mg total) by mouth every 6 (six) hours as needed.   1200 mL   0   . levofloxacin (LEVAQUIN) 500 MG tablet   Oral   Take 1.5 tablets (750 mg total) by mouth daily.   8 tablet   0   . ondansetron (ZOFRAN ODT) 4 MG disintegrating tablet   Oral   Take 1 tablet (4 mg total) by mouth every 8 (eight) hours as needed for  nausea.   10 tablet   0   . pseudoephedrine (SUDAFED) 120 MG 12 hr tablet   Oral   Take 1 tablet (120 mg total) by mouth every 12 (twelve) hours.   20 tablet   0    BP 146/85  Pulse 85  Temp(Src) 98.5 F (36.9 C) (Oral)  Resp 18  SpO2 98% Physical Exam  Nursing note and vitals reviewed. Constitutional: He is oriented to person, place, and time. He appears well-developed and well-nourished.  HENT:  Head: Normocephalic and atraumatic.  Right Ear: External ear normal.  Left Ear: External ear normal.  Nose: Right sinus exhibits frontal sinus tenderness. Left sinus exhibits frontal sinus tenderness.  Eyes: Pupils are equal, round,  and reactive to light.  Neck: Normal range of motion.  Cardiovascular: Normal rate and regular rhythm.   Pulmonary/Chest: Breath sounds normal. He is in respiratory distress. He has no wheezes.  Coughing  Musculoskeletal: Normal range of motion.  Lymphadenopathy:    He has no cervical adenopathy.  Neurological: He is alert and oriented to person, place, and time.  Skin: Skin is warm and dry.    ED Course  Procedures (including critical care time) Labs Review Labs Reviewed - No data to display Imaging Review Dg Chest 2 View  01/07/2013   CLINICAL DATA:  Cough, fever and shortness of breath for the past 3 weeks.  EXAM: CHEST  2 VIEW  COMPARISON:  Chest x-ray 02/13/2012.  FINDINGS: Lung volumes are normal. No consolidative airspace disease. No pleural effusions. No pneumothorax. No pulmonary nodule or mass noted. Pulmonary vasculature and the cardiomediastinal silhouette are within normal limits.  IMPRESSION: 1.  No radiographic evidence of acute cardiopulmonary disease.   Electronically Signed   By: Trudie Reed M.D.   On: 01/07/2013 00:59    EKG Interpretation   None       MDM   1. URI (upper respiratory infection)   2. Cough   3. Headache     X-ray, reviewed.  No sign of pneumonia.  Will be discharged home with Robitussin and Sudafed    Arman Filter, NP 01/07/13 0111

## 2014-01-16 ENCOUNTER — Emergency Department (HOSPITAL_COMMUNITY): Payer: BC Managed Care – PPO

## 2014-01-16 ENCOUNTER — Emergency Department (HOSPITAL_COMMUNITY)
Admission: EM | Admit: 2014-01-16 | Discharge: 2014-01-16 | Disposition: A | Discharge status: Home / Self Care | Payer: BC Managed Care – PPO | Attending: Emergency Medicine | Admitting: Emergency Medicine

## 2014-01-16 ENCOUNTER — Encounter (HOSPITAL_COMMUNITY): Payer: Self-pay

## 2014-01-16 DIAGNOSIS — Z79899 Other long term (current) drug therapy: Secondary | ICD-10-CM | POA: Diagnosis not present

## 2014-01-16 DIAGNOSIS — H578 Other specified disorders of eye and adnexa: Secondary | ICD-10-CM | POA: Diagnosis not present

## 2014-01-16 DIAGNOSIS — R6889 Other general symptoms and signs: Secondary | ICD-10-CM

## 2014-01-16 DIAGNOSIS — R0981 Nasal congestion: Secondary | ICD-10-CM

## 2014-01-16 DIAGNOSIS — R079 Chest pain, unspecified: Secondary | ICD-10-CM | POA: Insufficient documentation

## 2014-01-16 DIAGNOSIS — R059 Cough, unspecified: Secondary | ICD-10-CM

## 2014-01-16 DIAGNOSIS — Z792 Long term (current) use of antibiotics: Secondary | ICD-10-CM | POA: Diagnosis not present

## 2014-01-16 DIAGNOSIS — J320 Chronic maxillary sinusitis: Secondary | ICD-10-CM | POA: Insufficient documentation

## 2014-01-16 DIAGNOSIS — R05 Cough: Secondary | ICD-10-CM

## 2014-01-16 LAB — CBC WITH DIFFERENTIAL/PLATELET
BASOS ABS: 0 10*3/uL (ref 0.0–0.1)
Basophils Relative: 1 % (ref 0–1)
EOS PCT: 10 % — AB (ref 0–5)
Eosinophils Absolute: 0.6 10*3/uL (ref 0.0–0.7)
HCT: 41.1 % (ref 39.0–52.0)
Hemoglobin: 14.3 g/dL (ref 13.0–17.0)
LYMPHS PCT: 44 % (ref 12–46)
Lymphs Abs: 2.8 10*3/uL (ref 0.7–4.0)
MCH: 28 pg (ref 26.0–34.0)
MCHC: 34.8 g/dL (ref 30.0–36.0)
MCV: 80.4 fL (ref 78.0–100.0)
Monocytes Absolute: 0.4 10*3/uL (ref 0.1–1.0)
Monocytes Relative: 6 % (ref 3–12)
NEUTROS ABS: 2.4 10*3/uL (ref 1.7–7.7)
Neutrophils Relative %: 39 % — ABNORMAL LOW (ref 43–77)
PLATELETS: 241 10*3/uL (ref 150–400)
RBC: 5.11 MIL/uL (ref 4.22–5.81)
RDW: 13.3 % (ref 11.5–15.5)
WBC: 6.3 10*3/uL (ref 4.0–10.5)

## 2014-01-16 LAB — BASIC METABOLIC PANEL
ANION GAP: 15 (ref 5–15)
BUN: 9 mg/dL (ref 6–23)
CALCIUM: 9.5 mg/dL (ref 8.4–10.5)
CO2: 24 meq/L (ref 19–32)
Chloride: 105 mEq/L (ref 96–112)
Creatinine, Ser: 0.93 mg/dL (ref 0.50–1.35)
Glucose, Bld: 85 mg/dL (ref 70–99)
Potassium: 3.9 mEq/L (ref 3.7–5.3)
SODIUM: 144 meq/L (ref 137–147)

## 2014-01-16 LAB — I-STAT TROPONIN, ED: TROPONIN I, POC: 0 ng/mL (ref 0.00–0.08)

## 2014-01-16 MED ORDER — GI COCKTAIL ~~LOC~~
30.0000 mL | Freq: Once | ORAL | Status: AC
  Administered 2014-01-16: 30 mL via ORAL
  Filled 2014-01-16: qty 30

## 2014-01-16 MED ORDER — FAMOTIDINE 20 MG PO TABS: 20.0000 mg | ORAL_TABLET | Freq: Two times a day (BID) | ORAL | Status: DC

## 2014-01-16 MED ORDER — FLUTICASONE PROPIONATE 50 MCG/ACT NA SUSP: 2.0000 | Freq: Every day | NASAL | Status: DC

## 2014-01-16 MED ORDER — IBUPROFEN 800 MG PO TABS
800.0000 mg | ORAL_TABLET | Freq: Once | ORAL | Status: AC
  Administered 2014-01-16: 800 mg via ORAL
  Filled 2014-01-16: qty 1

## 2014-01-16 MED ORDER — OLOPATADINE HCL 0.2 % OP SOLN: 1.0000 [drp] | Freq: Every day | OPHTHALMIC | Status: DC

## 2014-01-16 NOTE — ED Provider Notes (Signed)
CSN: [id number]     Arrival date & time 01/16/14  1921 History   First MD Initiated Contact with Patient 01/16/14 2002     Chief Complaint  Patient presents with  . Chest Pain  . Headache  . Cough     (Consider location/radiation/quality/duration/timing/severity/associated sxs/prior Treatment) The history is provided by the patient and medical records.    This is a 48 y.o. M with no significant PMH, presenting to the ED for multiple complaints.  Patient states he has been having nasal congestion, intermittent headaches, and non-productive cough for the past 3 months.  States his face and ears feel "full" which is what he thinks is causing headache.  He was previously on nasal spray for this which helped but has since run out.  He denies fever, chills, or sweats.  No known sick contacts.  No hemoptysis. No neck pain/stiffness. Patient does have hx of pneumonia last year.  Denies shortness of breath.  Patient also notes an intermittent burning sensation in his chest.  States this often occurs after eating, but can happen independently as well.  He states spicy foods do tend to make this worse.  He denies nausea, vomiting, diarrhea.  Patient has no prior cardiac hx, known family hx.  No hx of GERD.  Patient has never been a smoker.  No intervention tried PTA.  VS stable on arrival.  History reviewed. No pertinent past medical history. Past Surgical History  Procedure Laterality Date  . Abdominal surgery     No family history on file. History  Substance Use Topics  . Smoking status: Never Smoker   . Smokeless tobacco: Not on file  . Alcohol Use: No    Review of Systems  HENT: Positive for congestion.   Respiratory: Positive for cough.   Cardiovascular: Positive for chest pain (burning).  Neurological: Positive for headaches.  All other systems reviewed and are negative.     Allergies  Review of patient's allergies indicates no known allergies.  Home Medications   Prior to  Admission medications   Medication Sig Start Date End Date Taking? Authorizing Provider  acetaminophen (TYLENOL) 500 MG tablet Take 1,000 mg by mouth every 6 (six) hours as needed. For pain or fever    Historical Provider, MD  guaiFENesin (ROBITUSSIN) 100 MG/5ML SOLN Take 5 mLs (100 mg total) by mouth every 6 (six) hours as needed. 01/07/13   Arman Filter, NP  levofloxacin (LEVAQUIN) 500 MG tablet Take 1.5 tablets (750 mg total) by mouth daily. 02/13/12   Kaitlyn Szekalski, PA-C  ondansetron (ZOFRAN ODT) 4 MG disintegrating tablet Take 1 tablet (4 mg total) by mouth every 8 (eight) hours as needed for nausea. 02/13/12   Kaitlyn Szekalski, PA-C  pseudoephedrine (SUDAFED) 120 MG 12 hr tablet Take 1 tablet (120 mg total) by mouth every 12 (twelve) hours. 01/07/13   Arman Filter, NP   BP 138/90 mmHg  Pulse 77  Temp(Src) 97.6 F (36.4 C) (Oral)  Resp 20  SpO2 99%   Physical Exam  Constitutional: He is oriented to person, place, and time. He appears well-developed and well-nourished. No distress.  HENT:  Head: Normocephalic and atraumatic.  Right Ear: Tympanic membrane and ear canal normal.  Left Ear: Tympanic membrane and ear canal normal.  Nose: Right sinus exhibits maxillary sinus tenderness and frontal sinus tenderness. Left sinus exhibits maxillary sinus tenderness and frontal sinus tenderness.  Mouth/Throat: Uvula is midline, oropharynx is clear and moist and mucous membranes are normal. No oropharyngeal  exudate, posterior oropharyngeal edema, posterior oropharyngeal erythema or tonsillar abscesses.  Eyes: EOM are normal. Pupils are equal, round, and reactive to light. Right conjunctiva is injected. Left conjunctiva is injected.  Conjunctiva mildly injected bilaterally, no drainage noted, no crusting  Neck: Normal range of motion and full passive range of motion without pain. Neck supple. No spinous process tenderness and no muscular tenderness present. No rigidity. No edema present.  No  meningismus  Cardiovascular: Normal rate, regular rhythm and normal heart sounds.   Pulmonary/Chest: Effort normal and breath sounds normal. No respiratory distress. He has no wheezes. He has no rhonchi. He has no rales.  Abdominal: Soft. Bowel sounds are normal. There is no tenderness. There is no guarding.  Musculoskeletal: Normal range of motion. He exhibits no edema.  Neurological: He is alert and oriented to person, place, and time.  AAOx3, answering questions appropriately; equal strength UE and LE bilaterally; CN grossly intact; moves all extremities appropriately without ataxia; no focal neuro deficits or facial asymmetry appreciated  Skin: Skin is warm and dry. He is not diaphoretic.  Psychiatric: He has a normal mood and affect.  Nursing note and vitals reviewed.   ED Course  Procedures (including critical care time) Labs Review Labs Reviewed  CBC WITH DIFFERENTIAL - Abnormal; Notable for the following:    Neutrophils Relative % 39 (*)    Eosinophils Relative 10 (*)    All other components within normal limits  BASIC METABOLIC PANEL  I-STAT TROPOININ, ED    Imaging Review Dg Chest 2 View  01/16/2014   CLINICAL DATA:  Nasal congestion, headache, cough  EXAM: CHEST  2 VIEW  COMPARISON:  01/07/2013  FINDINGS: There is no focal parenchymal opacity, pleural effusion, or pneumothorax. The heart and mediastinal contours are unremarkable.  The osseous structures are unremarkable.  IMPRESSION: No active cardiopulmonary disease.   Electronically Signed   By: Elige KoHetal  Patel   On: 01/16/2014 21:08     EKG Interpretation   Date/Time:  Friday January 16 2014 19:31:08 EST Ventricular Rate:  77 PR Interval:  166 QRS Duration: 98 QT Interval:  363 QTC Calculation: 411 R Axis:   25 Text Interpretation:  Sinus rhythm Consider left atrial enlargement ST  elev, probable normal early repol pattern Baseline wander in lead(s) V1 V2  V6 No significant change since last tracing Confirmed by  Anitra LauthPLUNKETT  MD,  Alphonzo LemmingsWHITNEY ([id number]) on 01/16/2014 10:47:37 PM      MDM   Final diagnoses:  Chest pain  Cough  Nasal congestion  Itchy eyes   48 y.o. M with multiple complaints-- notable nasal congestion, intermittent headache, and non-productive cough x 3 months.  Also notes burning sensation in his chest, worse after eating.  No diagnosis of GERD.  On exam, patient afebrile and in NAD.  His exam is overall non-infectious.  Conjunctiva mildly injected bilaterally-- states he does have issues with allergies/dry eyes.  Will obtain screening labs, ekg, CXR.  GI cocktail given.  EKG sinus rhythm without ischemic change. Troponin negative. Lab work is reassuring. Chest x-ray is clear. After GI cocktail, patient states burning sensation has resolved.  Low suspicion for ACS, PE, dissection, or other acute cardiac event.  Suspicion sx are GERD related-- recommended pepcid PRN.  After Motrin, patient states headache has resolved.  Neurologic exam non-focal, no fever or nuchal rigidity to suggest meningitis.  Low suspicion for intracranial pathology.  Will re-start on nasal spray, pataday eye drops for allergies/dry eyes.  Encouraged close FU with PCP.  Discussed plan with patient, he/she acknowledged understanding and agreed with plan of care.  Return precautions given for new or worsening symptoms.  Garlon HatchetLisa M Lianette Broussard, PA-C 01/16/14 2349  Garlon HatchetLisa M Holten Spano, PA-C 01/16/14 2350  Gwyneth SproutWhitney Plunkett, MD 01/17/14 [phone]

## 2014-01-16 NOTE — Discharge Instructions (Signed)
Take the prescribed medication as directed. °Follow-up with your primary care physician. °Return to the ED for new or worsening symptoms. ° °

## 2014-01-16 NOTE — ED Notes (Signed)
Pt presents with c/o nasal congestion, headache, and cough. Pt reports his symptoms started approx 3 months ago. Pt reports he feels like his chest is burning, no pain. EKG done in triage. Pt reports the burning in his chest is more prominent after he eats.

## 2015-04-17 ENCOUNTER — Emergency Department (HOSPITAL_COMMUNITY)
Admission: EM | Admit: 2015-04-17 | Discharge: 2015-04-17 | Disposition: A | Discharge status: Home / Self Care | Payer: BLUE CROSS/BLUE SHIELD | Attending: Emergency Medicine | Admitting: Emergency Medicine

## 2015-04-17 ENCOUNTER — Encounter (HOSPITAL_COMMUNITY): Payer: Self-pay | Admitting: Emergency Medicine

## 2015-04-17 ENCOUNTER — Emergency Department (HOSPITAL_COMMUNITY): Payer: BLUE CROSS/BLUE SHIELD

## 2015-04-17 DIAGNOSIS — K529 Noninfective gastroenteritis and colitis, unspecified: Secondary | ICD-10-CM | POA: Diagnosis not present

## 2015-04-17 DIAGNOSIS — R51 Headache: Secondary | ICD-10-CM | POA: Diagnosis not present

## 2015-04-17 DIAGNOSIS — I10 Essential (primary) hypertension: Secondary | ICD-10-CM | POA: Insufficient documentation

## 2015-04-17 DIAGNOSIS — R1032 Left lower quadrant pain: Secondary | ICD-10-CM | POA: Diagnosis present

## 2015-04-17 HISTORY — DX: Essential (primary) hypertension: I10

## 2015-04-17 LAB — CBC
HCT: 41.1 % (ref 39.0–52.0)
HEMOGLOBIN: 14.1 g/dL (ref 13.0–17.0)
MCH: 27.3 pg (ref 26.0–34.0)
MCHC: 34.3 g/dL (ref 30.0–36.0)
MCV: 79.5 fL (ref 78.0–100.0)
Platelets: 221 10*3/uL (ref 150–400)
RBC: 5.17 MIL/uL (ref 4.22–5.81)
RDW: 14 % (ref 11.5–15.5)
WBC: 8.9 10*3/uL (ref 4.0–10.5)

## 2015-04-17 LAB — COMPREHENSIVE METABOLIC PANEL
ALK PHOS: 56 U/L (ref 38–126)
ALT: 55 U/L (ref 17–63)
ANION GAP: 9 (ref 5–15)
AST: 30 U/L (ref 15–41)
Albumin: 4 g/dL (ref 3.5–5.0)
BILIRUBIN TOTAL: 1.6 mg/dL — AB (ref 0.3–1.2)
BUN: 9 mg/dL (ref 6–20)
CALCIUM: 8.7 mg/dL — AB (ref 8.9–10.3)
CO2: 24 mmol/L (ref 22–32)
Chloride: 101 mmol/L (ref 101–111)
Creatinine, Ser: 1.08 mg/dL (ref 0.61–1.24)
GFR calc non Af Amer: >60 mL/min (ref >60)
GLUCOSE: 117 mg/dL — AB (ref 65–99)
Potassium: 3.4 mmol/L — ABNORMAL LOW (ref 3.5–5.1)
Sodium: 134 mmol/L — ABNORMAL LOW (ref 135–145)
TOTAL PROTEIN: 7.4 g/dL (ref 6.5–8.1)

## 2015-04-17 LAB — URINALYSIS, ROUTINE W REFLEX MICROSCOPIC
BILIRUBIN URINE: NEGATIVE
Glucose, UA: NEGATIVE mg/dL
Hgb urine dipstick: NEGATIVE
Ketones, ur: NEGATIVE mg/dL
Leukocytes, UA: NEGATIVE
NITRITE: NEGATIVE
PROTEIN: NEGATIVE mg/dL
SPECIFIC GRAVITY, URINE: 1.014 (ref 1.005–1.030)
pH: 6.5 (ref 5.0–8.0)

## 2015-04-17 LAB — LIPASE, BLOOD: Lipase: 16 U/L (ref 11–51)

## 2015-04-17 MED ORDER — METRONIDAZOLE 500 MG PO TABS: 500.0000 mg | ORAL_TABLET | Freq: Two times a day (BID) | ORAL | Status: DC

## 2015-04-17 MED ORDER — CIPROFLOXACIN HCL 500 MG PO TABS: 500.0000 mg | ORAL_TABLET | Freq: Two times a day (BID) | ORAL | Status: DC

## 2015-04-17 MED ORDER — IOHEXOL 300 MG/ML  SOLN
25.0000 mL | Freq: Once | INTRAMUSCULAR | Status: AC | PRN
  Administered 2015-04-17: 25 mL via ORAL

## 2015-04-17 MED ORDER — IOHEXOL 300 MG/ML  SOLN
100.0000 mL | Freq: Once | INTRAMUSCULAR | Status: AC | PRN
  Administered 2015-04-17: 100 mL via INTRAVENOUS

## 2015-04-17 NOTE — ED Notes (Signed)
Pt complaint of lower abdominal cramping onset this morning; reports one small episode of diarrhea; denies n/v.

## 2015-04-17 NOTE — Discharge Instructions (Signed)
1. Medications: Please take all of your antibiotics until finished! 2. Treatment: rest, drink plenty of fluids 3. Follow Up: Please follow up with your primary doctor in 7 days for discussion of your diagnoses and further evaluation after today's visit; Please return to the ER for new or worsening symptoms, any additional concerns.

## 2015-04-17 NOTE — ED Provider Notes (Signed)
CSN: [id number]     Arrival date & time 04/17/15  1340 History   First MD Initiated Contact with Patient 04/17/15 1531     Chief Complaint  Patient presents with  . Abdominal Pain     (Consider location/radiation/quality/duration/timing/severity/associated sxs/prior Treatment) Patient is a 50 y.o. male presenting with abdominal pain. The history is provided by the patient and medical records. No language interpreter was used.  Abdominal Pain Associated symptoms: fatigue   Associated symptoms: no chills, no cough, no dysuria, no fever, no nausea, no shortness of breath, no sore throat and no vomiting    Daron Offer is a 50 y.o. male  with a PMH of HTN who presents to the Emergency Department complaining of crampy bilateral lower quadrant abdominal pain L>R since this morning. Associated symptoms include three episodes of nonbloody loose stools today and headache. Denies nausea, vomiting, fever. No medications taken prior to arrival. No alleviating or aggravating symptoms noted.    Past Medical History  Diagnosis Date  . Hypertension    Past Surgical History  Procedure Laterality Date  . Abdominal surgery     No family history on file. Social History  Substance Use Topics  . Smoking status: Never Smoker   . Smokeless tobacco: None  . Alcohol Use: No    Review of Systems  Constitutional: Positive for fatigue. Negative for fever and chills.  HENT: Negative for congestion and sore throat.   Eyes: Negative for visual disturbance.  Respiratory: Negative for cough, shortness of breath and wheezing.   Cardiovascular: Negative.   Gastrointestinal: Positive for abdominal pain. Negative for nausea, vomiting and blood in stool.  Genitourinary: Negative for dysuria.  Musculoskeletal: Negative for myalgias, back pain and arthralgias.  Skin: Negative for rash.  Neurological: Positive for headaches. Negative for dizziness.      Allergies  Review of patient's allergies indicates no  known allergies.  Home Medications   Prior to Admission medications   Medication Sig Start Date End Date Taking? Authorizing Provider  fluticasone (FLONASE) 50 MCG/ACT nasal spray Place 2 sprays into both nostrils daily. Patient taking differently: Place 2 sprays into both nostrils daily as needed for allergies.  01/16/14  Yes Garlon Hatchet, PA-C  ibuprofen (ADVIL,MOTRIN) 200 MG tablet Take 400-600 mg by mouth every 6 (six) hours as needed for headache or moderate pain.   Yes Historical Provider, MD  lisinopril (PRINIVIL,ZESTRIL) 20 MG tablet TK 1 T PO D 03/19/15  Yes Historical Provider, MD  ciprofloxacin (CIPRO) 500 MG tablet Take 1 tablet (500 mg total) by mouth 2 (two) times daily. 04/17/15   Chase Picket Mailani Degroote, PA-C  famotidine (PEPCID) 20 MG tablet Take 1 tablet (20 mg total) by mouth 2 (two) times daily. Patient taking differently: Take 20 mg by mouth daily as needed for heartburn.  01/16/14   Garlon Hatchet, PA-C  guaiFENesin (ROBITUSSIN) 100 MG/5ML SOLN Take 5 mLs (100 mg total) by mouth every 6 (six) hours as needed. Patient not taking: Reported on 04/17/2015 01/07/13   Earley Favor, NP  levofloxacin (LEVAQUIN) 500 MG tablet Take 1.5 tablets (750 mg total) by mouth daily. Patient not taking: Reported on 04/17/2015 02/13/12   Emilia Beck, PA-C  metroNIDAZOLE (FLAGYL) 500 MG tablet Take 1 tablet (500 mg total) by mouth 2 (two) times daily. 04/17/15   Chase Picket Brenlee Koskela, PA-C  Olopatadine HCl 0.2 % SOLN Place 1 drop into both eyes daily. Patient not taking: Reported on 04/17/2015 01/16/14   Garlon Hatchet, PA-C  ondansetron (ZOFRAN ODT) 4 MG disintegrating tablet Take 1 tablet (4 mg total) by mouth every 8 (eight) hours as needed for nausea. Patient not taking: Reported on 04/17/2015 02/13/12   Emilia Beck, PA-C  pseudoephedrine (SUDAFED) 120 MG 12 hr tablet Take 1 tablet (120 mg total) by mouth every 12 (twelve) hours. Patient not taking: Reported on 04/17/2015 01/07/13   Earley Favor, NP   BP 120/80 mmHg  Pulse 102  Temp(Src) 98.7 F (37.1 C) (Oral)  Resp 18  Ht  (2.184 m)  Wt 115.214 kg  BMI 24.15 kg/m2  SpO2 99% Physical Exam  Constitutional: He is oriented to person, place, and time. He appears well-developed and well-nourished.  Alert and in no acute distress  HENT:  Head: Normocephalic and atraumatic.  Cardiovascular: Normal rate, regular rhythm and normal heart sounds.  Exam reveals no gallop and no friction rub.   No murmur heard. Pulmonary/Chest: Effort normal and breath sounds normal. No respiratory distress. He has no wheezes. He has no rales.  Abdominal: Soft. Bowel sounds are normal. He exhibits no distension and no mass. There is no tenderness. There is no rebound and no guarding.  TTP of bilateral lower abdomen L>R  Musculoskeletal: He exhibits no edema.  Neurological: He is alert and oriented to person, place, and time.  Skin: Skin is warm and dry. No rash noted.  Psychiatric: He has a normal mood and affect. His behavior is normal. Judgment and thought content normal.  Nursing note and vitals reviewed.   ED Course  Procedures (including critical care time) Labs Review Labs Reviewed  COMPREHENSIVE METABOLIC PANEL - Abnormal; Notable for the following:    Sodium 134 (*)    Potassium 3.4 (*)    Glucose, Bld 117 (*)    Calcium 8.7 (*)    Total Bilirubin 1.6 (*)    All other components within normal limits  LIPASE, BLOOD  CBC  URINALYSIS, ROUTINE W REFLEX MICROSCOPIC (NOT AT [facility])    Imaging Review Ct Abdomen Pelvis W Contrast  04/17/2015  CLINICAL DATA:  50 year old male with lower abdominal and pelvic pain today. EXAM: CT ABDOMEN AND PELVIS WITH CONTRAST TECHNIQUE: Multidetector CT imaging of the abdomen and pelvis was performed using the standard protocol following bolus administration of intravenous contrast. CONTRAST:  OMNIPAQUE IOHEXOL 300 MG/ML  SOLN COMPARISON:  02/13/2012 and prior CTs FINDINGS: Lower chest:   Unremarkable Hepatobiliary: Hepatic steatosis again identified. The gallbladder is unremarkable. There is no evidence of and biliary dilatation. Pancreas: Unremarkable Spleen: Unremarkable Adrenals/Urinary Tract: The kidneys, adrenal glands and bladder are unremarkable. Stomach/Bowel: Diffuse circumferential wall thickening of the ascending and transverse colon with mild adjacent inflammation identified compatible with colitis. There is no evidence of pneumoperitoneum, abscess or bowel obstruction. The visualized mesenteric vessels are patent. Vascular/Lymphatic: Unremarkable. No enlarged lymph nodes or abdominal aortic aneurysm. Reproductive: Prostate within normal limits Other: No free fluid. Musculoskeletal: No acute or suspicious abnormality. IMPRESSION: Colitis involving the ascending and transverse colon - likely infectious/ inflammatory. No evidence of abscess, pneumoperitoneum or bowel obstruction. Hepatic steatosis. Electronically Signed   By: Harmon Pier M.D.   On: 04/17/2015 16:43   I have personally reviewed and evaluated these images and lab results as part of my medical decision-making.   EKG Interpretation None      MDM   Final diagnoses:  Colitis   Daron Offer presents with bilateral lower abdominal pain L>R associated with nonbloody loose stools with onset today.   Labs: CBC, lipase,  UA wdl; CMP - see above.  Imaging: CT shows colitis infectious vs. Inflammatory.   Will treat with cipro/flagyl - encouraged PCP follow up. Return precautions and home care instructions given. All questions answered.   [facility] Deseray Daponte, PA-C 04/17/15 1713  Lorre Nick, MD 04/18/15 [phone]

## 2016-11-15 IMAGING — CT CT ABD-PELV W/ CM
2 of 5 series · 16 of 46 positions shown, 18 images · IV contrast (OMNIPAQUE 300)
Comparison: 02/13/2012 and prior CTs

CLINICAL DATA: 50-year-old male with lower abdominal and pelvic
pain today.

EXAM:
CT ABDOMEN AND PELVIS WITH CONTRAST
TECHNIQUE: Multidetector CT imaging of the abdomen and pelvis was performed
using the standard protocol following bolus administration of
intravenous contrast.
CONTRAST:  100mL OMNIPAQUE IOHEXOL 300 MG/ML  SOLN

[Series 2: abd/pel with · axial · 0.82mm/px · z∈[-515,-45]mm · 13 of 106 slices shown, 15 images]
[im 6/106  soft-tissue]
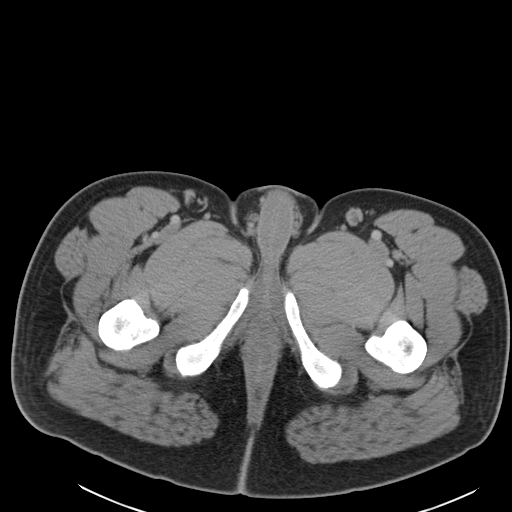
[im 6/106  bone]
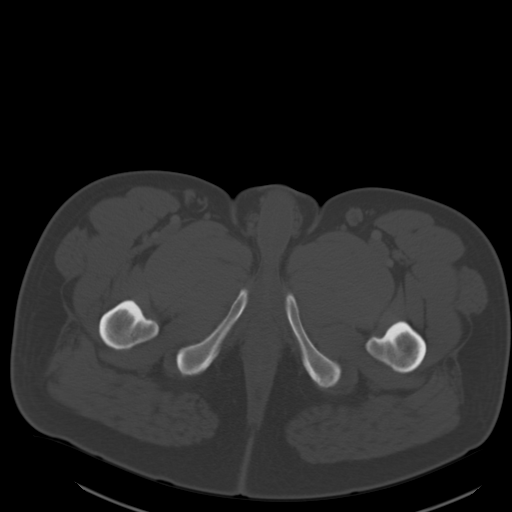
[im 17/106  soft-tissue]
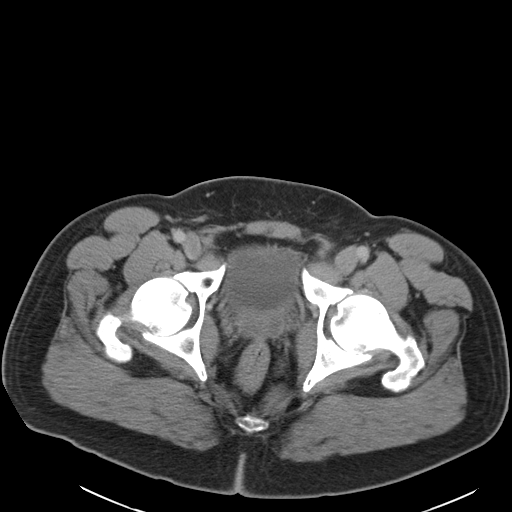
[im 23/106  soft-tissue]
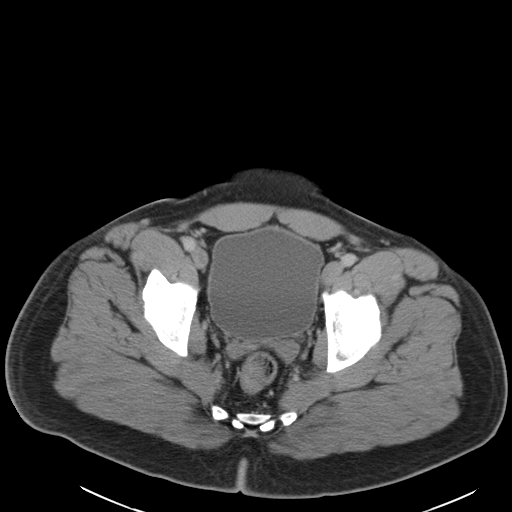
[im 28/106  soft-tissue]
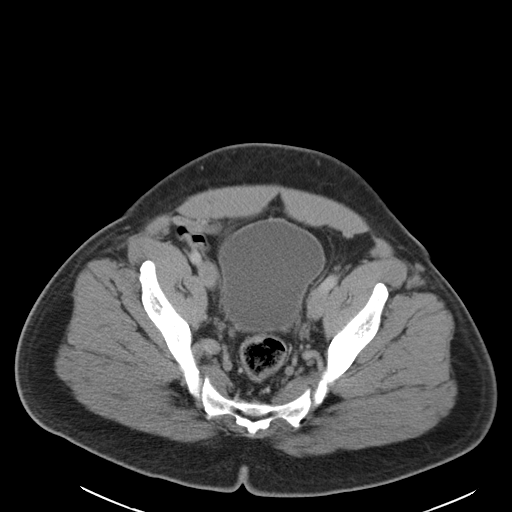
[im 39/106  soft-tissue]
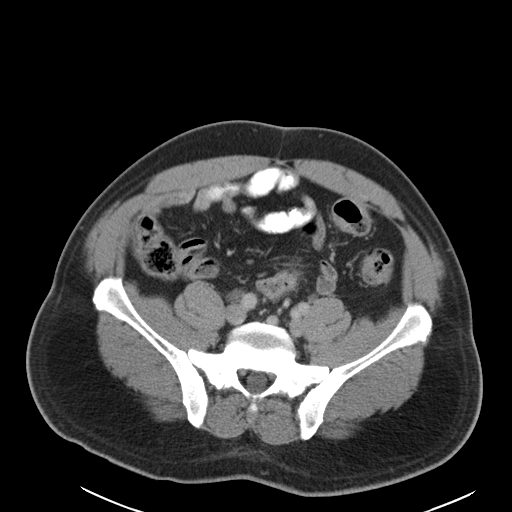
[im 45/106  soft-tissue]
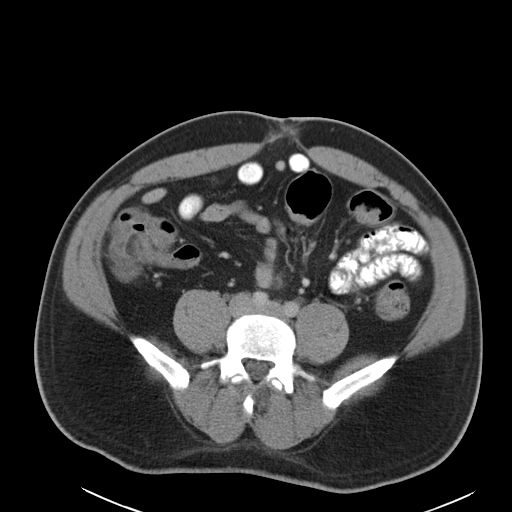
[im 56/106  soft-tissue]
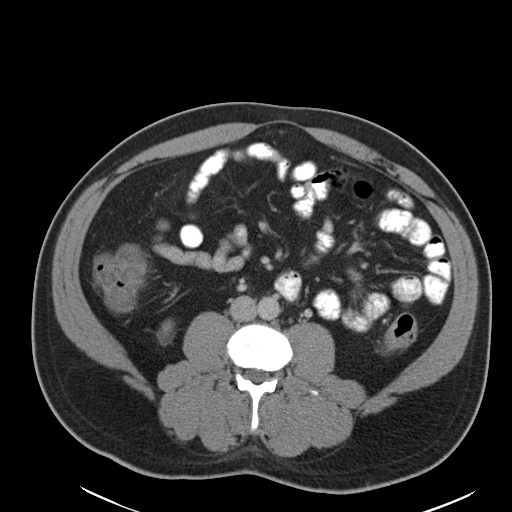
[im 61/106  soft-tissue]
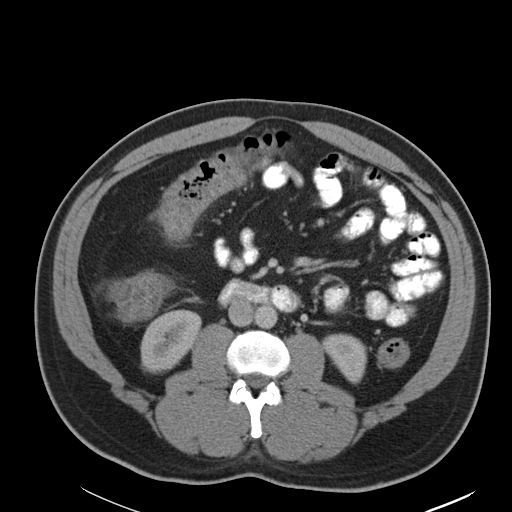
[im 67/106  soft-tissue]
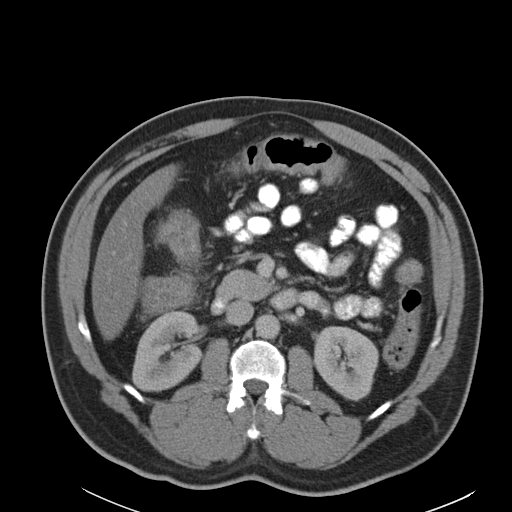
[im 67/106  bone]
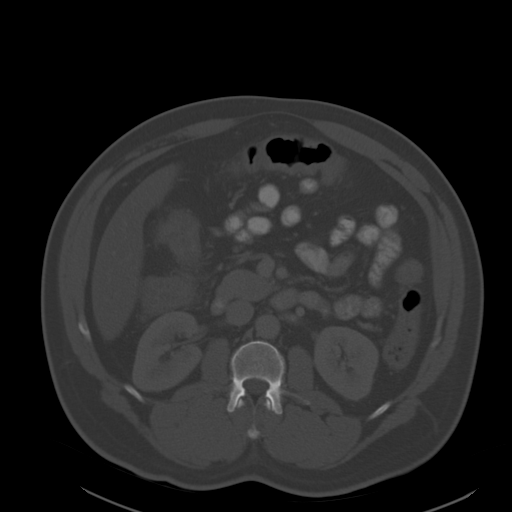
[im 78/106  soft-tissue]
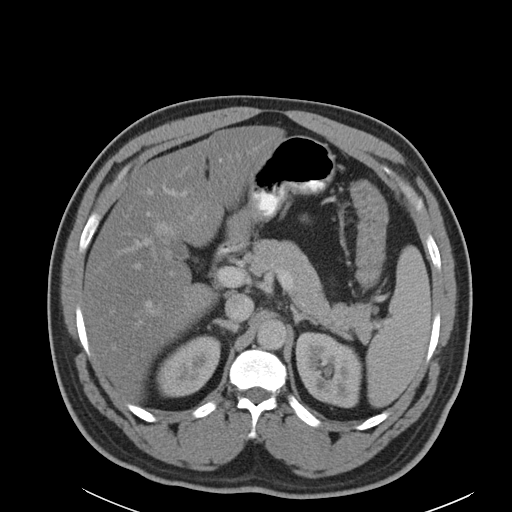
[im 83/106  soft-tissue]
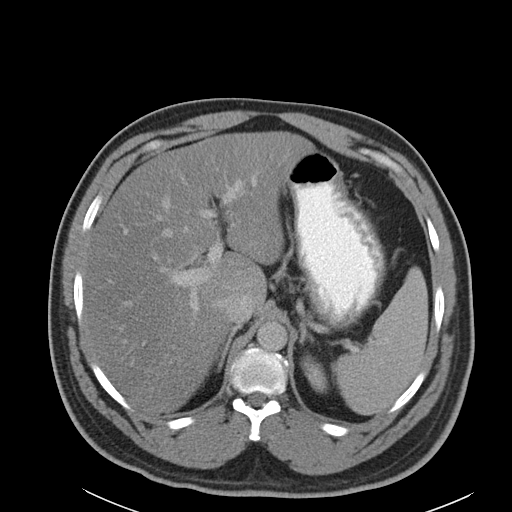
[im 89/106  soft-tissue]
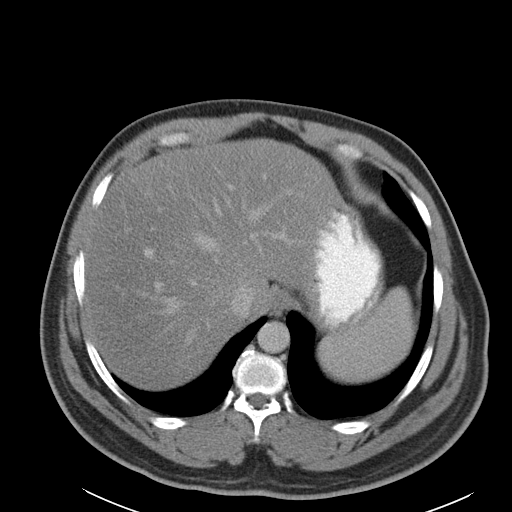
[im 100/106  soft-tissue]
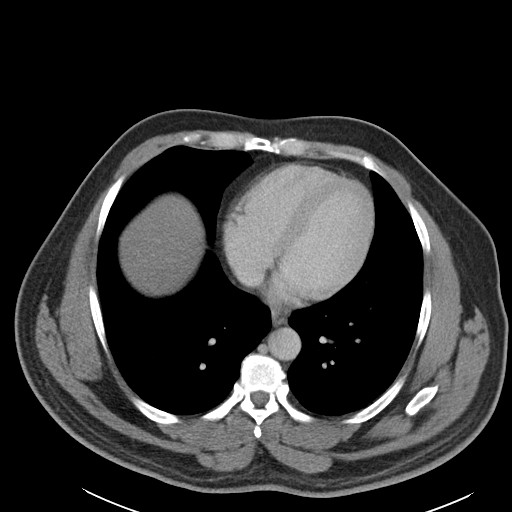

[Series 5: coronal a/|p · coronal · 0.82mm/px · 3 of 127 slices shown]
[im 43/127  soft-tissue]
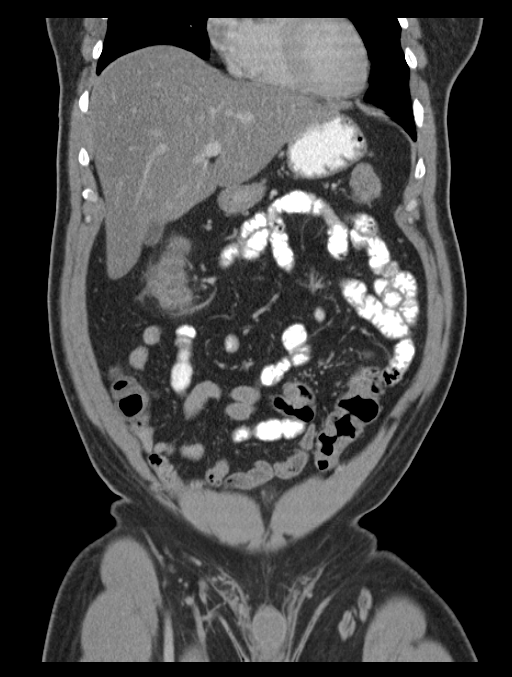
[im 57/127  soft-tissue]
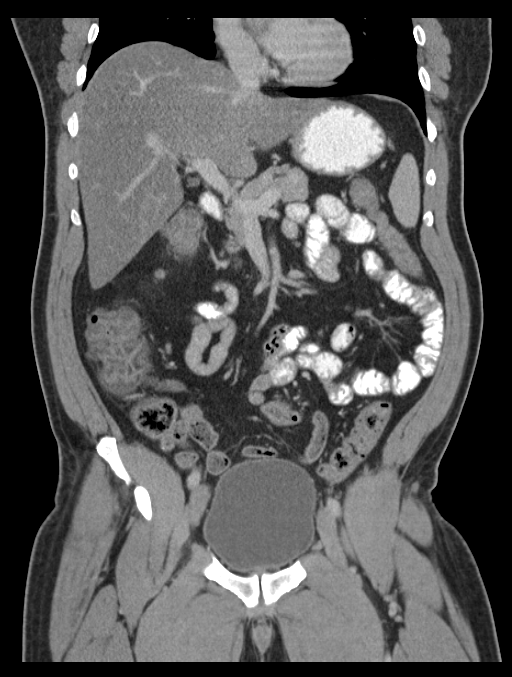
[im 71/127  soft-tissue]
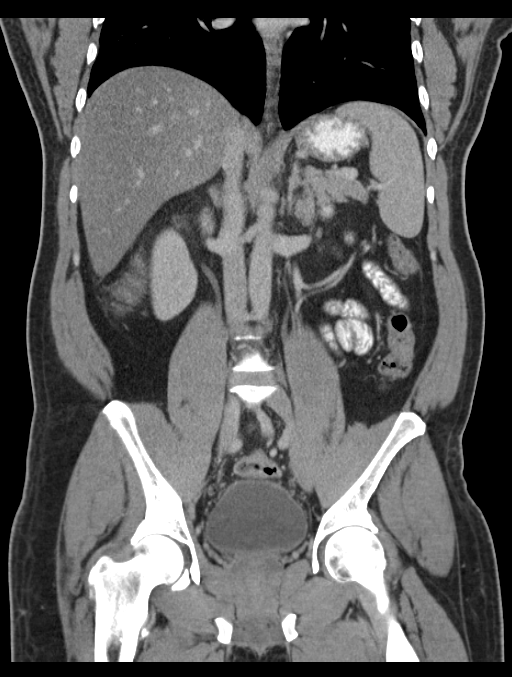

[16 of 46 positions shown; findings below may reference images not displayed]

FINDINGS: Lower chest:  Unremarkable

Hepatobiliary: Hepatic steatosis again identified. The gallbladder
is unremarkable. There is no evidence of and biliary dilatation.

Pancreas: Unremarkable

Spleen: Unremarkable

Adrenals/Urinary Tract: The kidneys, adrenal glands and bladder are
unremarkable.

Stomach/Bowel: Diffuse circumferential wall thickening of the
ascending and transverse colon with mild adjacent inflammation
identified compatible with colitis. There is no evidence of
pneumoperitoneum, abscess or bowel obstruction. The visualized
mesenteric vessels are patent.

Vascular/Lymphatic: Unremarkable. No enlarged lymph nodes or
abdominal aortic aneurysm.

Reproductive: Prostate within normal limits

Other: No free fluid.

Musculoskeletal: No acute or suspicious abnormality.
IMPRESSION: Colitis involving the ascending and transverse colon - likely
infectious/ inflammatory. No evidence of abscess, pneumoperitoneum
or bowel obstruction.

Hepatic steatosis.

## 2017-08-10 ENCOUNTER — Emergency Department (HOSPITAL_COMMUNITY): Payer: BLUE CROSS/BLUE SHIELD

## 2017-08-10 ENCOUNTER — Other Ambulatory Visit: Payer: Self-pay

## 2017-08-10 ENCOUNTER — Encounter (HOSPITAL_COMMUNITY): Payer: Self-pay | Admitting: *Deleted

## 2017-08-10 ENCOUNTER — Emergency Department (HOSPITAL_COMMUNITY)
Admission: EM | Admit: 2017-08-10 | Discharge: 2017-08-10 | Disposition: A | Discharge status: Home / Self Care | Payer: BLUE CROSS/BLUE SHIELD | Attending: Emergency Medicine | Admitting: Emergency Medicine

## 2017-08-10 DIAGNOSIS — N5082 Scrotal pain: Secondary | ICD-10-CM | POA: Diagnosis present

## 2017-08-10 DIAGNOSIS — N451 Epididymitis: Secondary | ICD-10-CM | POA: Insufficient documentation

## 2017-08-10 DIAGNOSIS — I10 Essential (primary) hypertension: Secondary | ICD-10-CM | POA: Insufficient documentation

## 2017-08-10 DIAGNOSIS — Z79899 Other long term (current) drug therapy: Secondary | ICD-10-CM | POA: Diagnosis not present

## 2017-08-10 LAB — COMPREHENSIVE METABOLIC PANEL
ALT: 40 U/L (ref 17–63)
ANION GAP: 7 (ref 5–15)
AST: 27 U/L (ref 15–41)
Albumin: 4 g/dL (ref 3.5–5.0)
Alkaline Phosphatase: 77 U/L (ref 38–126)
BUN: 8 mg/dL (ref 6–20)
CHLORIDE: 101 mmol/L (ref 101–111)
CO2: 28 mmol/L (ref 22–32)
CREATININE: 1.2 mg/dL (ref 0.61–1.24)
Calcium: 9.3 mg/dL (ref 8.9–10.3)
GFR calc non Af Amer: >60 mL/min (ref >60)
Glucose, Bld: 130 mg/dL — ABNORMAL HIGH (ref 65–99)
Potassium: 3.4 mmol/L — ABNORMAL LOW (ref 3.5–5.1)
SODIUM: 136 mmol/L (ref 135–145)
Total Bilirubin: 1.1 mg/dL (ref 0.3–1.2)
Total Protein: 7 g/dL (ref 6.5–8.1)

## 2017-08-10 LAB — CBC
HCT: 40.6 % (ref 39.0–52.0)
Hemoglobin: 13.7 g/dL (ref 13.0–17.0)
MCH: 27.6 pg (ref 26.0–34.0)
MCHC: 33.7 g/dL (ref 30.0–36.0)
MCV: 81.9 fL (ref 78.0–100.0)
PLATELETS: 220 10*3/uL (ref 150–400)
RBC: 4.96 MIL/uL (ref 4.22–5.81)
RDW: 13.7 % (ref 11.5–15.5)
WBC: 9.4 10*3/uL (ref 4.0–10.5)

## 2017-08-10 LAB — URINALYSIS, ROUTINE W REFLEX MICROSCOPIC
Bilirubin Urine: NEGATIVE
GLUCOSE, UA: NEGATIVE mg/dL
Hgb urine dipstick: NEGATIVE
Ketones, ur: NEGATIVE mg/dL
LEUKOCYTES UA: NEGATIVE
Nitrite: NEGATIVE
PROTEIN: NEGATIVE mg/dL
SPECIFIC GRAVITY, URINE: 1.008 (ref 1.005–1.030)
pH: 6 (ref 5.0–8.0)

## 2017-08-10 MED ORDER — IBUPROFEN 400 MG PO TABS
600.0000 mg | ORAL_TABLET | Freq: Once | ORAL | Status: AC
  Administered 2017-08-10: 600 mg via ORAL
  Filled 2017-08-10: qty 1

## 2017-08-10 MED ORDER — CIPROFLOXACIN HCL 500 MG PO TABS: 500.0000 mg | ORAL_TABLET | Freq: Two times a day (BID) | ORAL | 0 refills | Status: DC

## 2017-08-10 NOTE — ED Notes (Signed)
Patient given gown and instructed to undress for the provider to see him

## 2017-08-10 NOTE — Discharge Instructions (Signed)
Cipro as prescribed for infection.  Please follow-up with urology for reevaluation.

## 2017-08-10 NOTE — ED Triage Notes (Signed)
Pt c/o difficulty urinating with dysuria. Also reports "my organ in swollen" while pointing to lower abd

## 2017-08-10 NOTE — ED Provider Notes (Signed)
[facility] Note   CSN: [id number] Arrival date & time: 08/10/17  0310     History   Chief Complaint Chief Complaint  Patient presents with  . Abdominal Pain    HPI Daron OfferHarouna Lati is a 52 y.o. male.  HPI Daron OfferHarouna Lati is a 52 y.o. male's with history of hypertension, presents to emergency department with complaint of scrotal pain.  Patient states that he was at work, states he was sitting down when he suddenly developed pain in his left scrotum.  He denies any swelling.  He states it is painful to urinate at times.  Denies any frequency or urgency.  Denies any difficulty urinating.  Denies history of the same.  No trauma.  No fever or chills.  No nausea or vomiting.  Did not take any medications prior to coming in.  Denies any pain in his abdomen or back.  Past Medical History:  Diagnosis Date  . Hypertension     There are no active problems to display for this patient.   Past Surgical History:  Procedure Laterality Date  . ABDOMINAL SURGERY          Home Medications    Prior to Admission medications   Medication Sig Start Date End Date Taking? Authorizing Provider  ciprofloxacin (CIPRO) 500 MG tablet Take 1 tablet (500 mg total) by mouth 2 (two) times daily. 04/17/15   Ward, Chase PicketJaime Pilcher, PA-C  famotidine (PEPCID) 20 MG tablet Take 1 tablet (20 mg total) by mouth 2 (two) times daily. Patient taking differently: Take 20 mg by mouth daily as needed for heartburn.  01/16/14   Garlon HatchetSanders, Lisa M, PA-C  fluticasone (FLONASE) 50 MCG/ACT nasal spray Place 2 sprays into both nostrils daily. Patient taking differently: Place 2 sprays into both nostrils daily as needed for allergies.  01/16/14   Garlon HatchetSanders, Lisa M, PA-C  guaiFENesin (ROBITUSSIN) 100 MG/5ML SOLN Take 5 mLs (100 mg total) by mouth every 6 (six) hours as needed. Patient not taking: Reported on 04/17/2015 01/07/13   Earley FavorSchulz, Gail, NP  ibuprofen (ADVIL,MOTRIN) 200 MG tablet Take  400-600 mg by mouth every 6 (six) hours as needed for headache or moderate pain.    [provider]  levofloxacin (LEVAQUIN) 500 MG tablet Take 1.5 tablets (750 mg total) by mouth daily. Patient not taking: Reported on 04/17/2015 02/13/12   Emilia BeckSzekalski, Kaitlyn, PA-C  lisinopril (PRINIVIL,ZESTRIL) 20 MG tablet TK 1 T PO D 03/19/15   [provider]  metroNIDAZOLE (FLAGYL) 500 MG tablet Take 1 tablet (500 mg total) by mouth 2 (two) times daily. 04/17/15   Ward, Chase PicketJaime Pilcher, PA-C  Olopatadine HCl 0.2 % SOLN Place 1 drop into both eyes daily. Patient not taking: Reported on 04/17/2015 01/16/14   Garlon HatchetSanders, Lisa M, PA-C  ondansetron (ZOFRAN ODT) 4 MG disintegrating tablet Take 1 tablet (4 mg total) by mouth every 8 (eight) hours as needed for nausea. Patient not taking: Reported on 04/17/2015 02/13/12   Emilia BeckSzekalski, Kaitlyn, PA-C  pseudoephedrine (SUDAFED) 120 MG 12 hr tablet Take 1 tablet (120 mg total) by mouth every 12 (twelve) hours. Patient not taking: Reported on 04/17/2015 01/07/13   Earley FavorSchulz, Gail, NP    Family History No family history on file.  Social History Social History   Tobacco Use  . Smoking status: Never Smoker  . Smokeless tobacco: Never Used  Substance Use Topics  . Alcohol use: No  . Drug use: No     Allergies   Patient  has no known allergies.   Review of Systems Review of Systems  Constitutional: Negative for chills and fever.  Respiratory: Negative for cough, chest tightness and shortness of breath.   Cardiovascular: Negative for chest pain, palpitations and leg swelling.  Gastrointestinal: Negative for abdominal distention, abdominal pain, diarrhea, nausea and vomiting.  Genitourinary: Positive for dysuria and testicular pain. Negative for discharge, frequency, hematuria, penile pain, penile swelling, scrotal swelling and urgency.  Musculoskeletal: Negative for arthralgias, myalgias, neck pain and neck stiffness.  Skin: Negative for rash.    Allergic/Immunologic: Negative for immunocompromised state.  Neurological: Negative for dizziness, weakness, light-headedness, numbness and headaches.  All other systems reviewed and are negative.    Physical Exam Updated Vital Signs BP (!) 147/103   Pulse 86   Temp 99.7 F (37.6 C) (Oral)   Resp 16   SpO2 97%   Physical Exam  Constitutional: He appears well-developed and well-nourished. No distress.  HENT:  Head: Normocephalic and atraumatic.  Eyes: Conjunctivae are normal.  Neck: Neck supple.  Cardiovascular: Normal rate, regular rhythm and normal heart sounds.  Pulmonary/Chest: Effort normal. No respiratory distress. He has no wheezes. He has no rales.  Abdominal: Soft. Bowel sounds are normal. He exhibits no distension. There is no tenderness. There is no rebound.  Genitourinary:  Genitourinary Comments: Normal penis.  Normal right scrotum, testicle is nontender.  Normal-appearing left scrotum, diffuse tenderness, no obvious masses palpated.  Cremasteric reflex present.  Musculoskeletal: He exhibits no edema.  Neurological: He is alert.  Skin: Skin is warm and dry.  Nursing note and vitals reviewed.    ED Treatments / Results  Labs (all labs ordered are listed, but only abnormal results are displayed) Labs Reviewed  COMPREHENSIVE METABOLIC PANEL - Abnormal; Notable for the following components:      Result Value   Potassium 3.4 (*)    Glucose, Bld 130 (*)    All other components within normal limits  URINE CULTURE  CBC  URINALYSIS, ROUTINE W REFLEX MICROSCOPIC  GC/CHLAMYDIA PROBE AMP ([place]) NOT AT [facility]    EKG None  Radiology US Scrotum W/doppler  Result Date: 08/10/2017 CLINICAL DATA:  Left scrotal pain EXAM: SCROTAL ULTRASOUND DOPPLER ULTRASOUND OF THE TESTICLES TECHNIQUE: Complete ultrasound examination of the testicles, epididymis, and other scrotal structures was performed. Color and spectral Doppler ultrasound were also utilized to evaluate blood  flow to the testicles. COMPARISON:  None. FINDINGS: Right testicle Measurements: 3.5 x 1.8 x 3.3 cm. No mass or microlithiasis visualized. Left testicle Measurements: 3.2 x 1.8 x 2.2 cm. No mass or microlithiasis visualized. Right epididymis:  Normal in size and appearance. Left epididymis: Enlarged with hyperemia compatible with epididymitis. Hydrocele:  None visualized. Varicocele:  None visualized. Pulsed Doppler interrogation of both testes demonstrates normal low resistance arterial and venous waveforms bilaterally. IMPRESSION: Enlarged, hyperemic left epididymis compatible with epididymitis. No testicular abnormality. Electronically Signed   By: Charlett Nose M.D.   On: 08/10/2017 08:56    Procedures Procedures (including critical care time)  Medications Ordered in ED Medications - No data to display   Initial Impression / Assessment and Plan / ED Course  I have reviewed the triage vital signs and the nursing notes.  Pertinent labs & imaging results that were available during my care of the patient were reviewed by me and considered in my medical decision making (see chart for details).    Patient in emergency department with left scrotal pain.  Labs obtained at triage, normal.  Will get  ultrasound for further evaluation.  His urine is not appear to show any signs of infection  9:12 AM Ultrasound consistent with epididymitis.  It is interesting that his urine is clear, question whether this could be a nonbacterial epididymitis.  Again he denies any trauma, no other complaints.  Gc/chlamydia sent. Urine culture ordered. Will start on ibuprofen for pain, will also start on Cipro, have him follow-up with urology.  BP elevated today. Will have pt follow up with pcp closely for recheck.   Vitals:   08/10/17 0730 08/10/17 0745 08/10/17 0800 08/10/17 0838  BP: (!) 174/96 (!) 168/96 (!) 168/96 (!) 154/94  Pulse: 69 71 69 78  Resp:    16  Temp:      TempSrc:      SpO2: 97% 98% 99% 97%      Final Clinical Impressions(s) / ED Diagnoses   Final diagnoses:  Epididymitis    ED Discharge Orders    None       Jaynie Crumble, PA-C 08/10/17 1610    Doug Sou, MD 08/10/17 [phone]

## 2017-08-11 LAB — URINE CULTURE: Culture: NO GROWTH

## 2017-08-13 LAB — GC/CHLAMYDIA PROBE AMP (~~LOC~~) NOT AT ARMC
Chlamydia: NEGATIVE
Neisseria Gonorrhea: NEGATIVE

## 2018-02-18 ENCOUNTER — Emergency Department (HOSPITAL_COMMUNITY)
Admission: EM | Admit: 2018-02-18 | Discharge: 2018-02-18 | Discharge status: Against Medical Advice | Payer: BLUE CROSS/BLUE SHIELD

## 2020-05-15 IMAGING — US US SCROTUM W/ DOPPLER COMPLETE
1 series · 14 of 25 positions shown · non-contrast
Comparison: None.

CLINICAL DATA: Left scrotal pain

EXAM:
SCROTAL ULTRASOUND
DOPPLER ULTRASOUND OF THE TESTICLES
TECHNIQUE: Complete ultrasound examination of the testicles, epididymis, and
other scrotal structures was performed. Color and spectral Doppler
ultrasound were also utilized to evaluate blood flow to the
testicles.

[Series 1: us scrotum w/ doppler complete · 0.07mm/px · 14 of 72 slices shown]
[im 1/72]
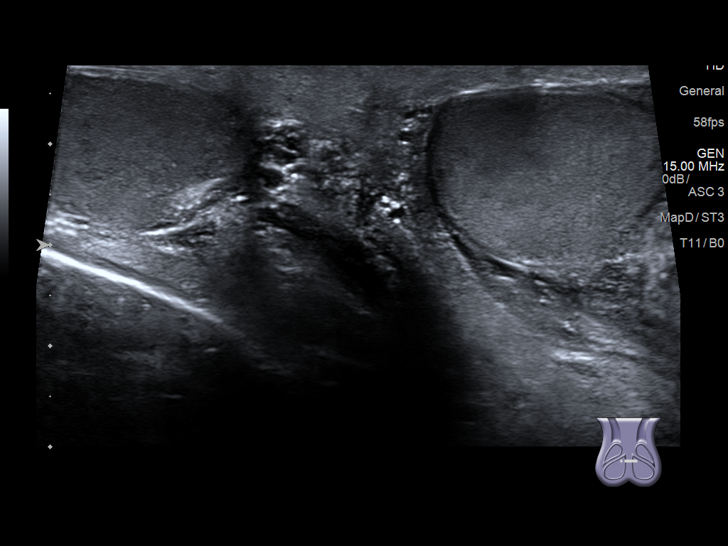
[im 6/72]
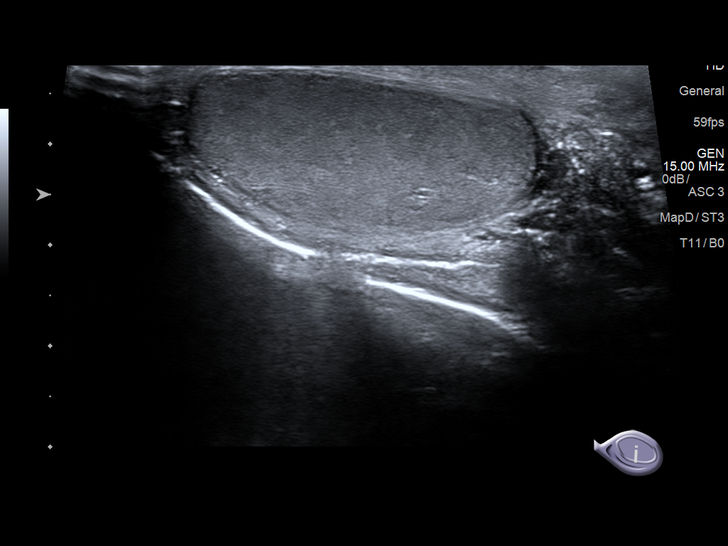
[im 12/72]
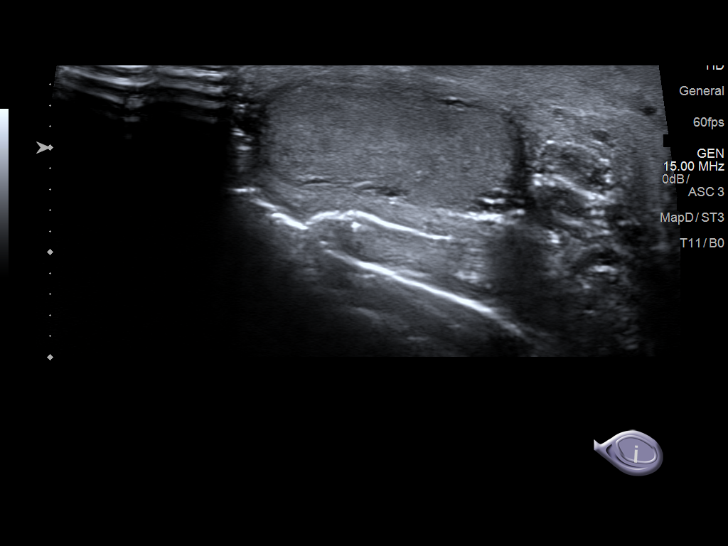
[im 18/72]
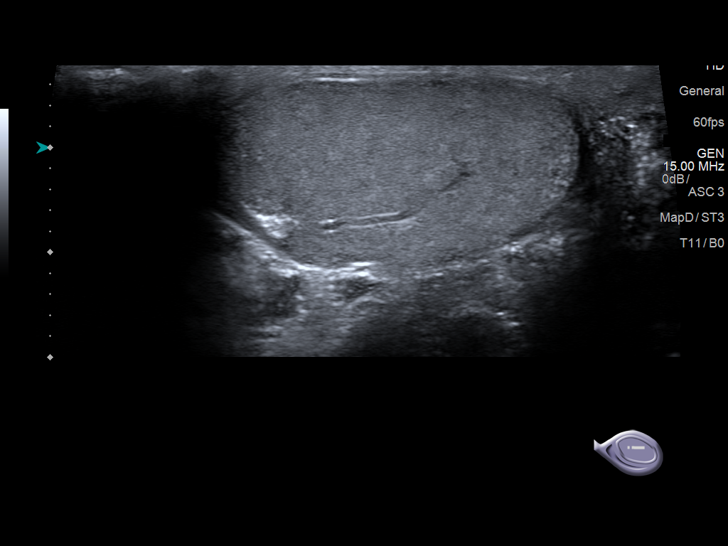
[im 24/72]
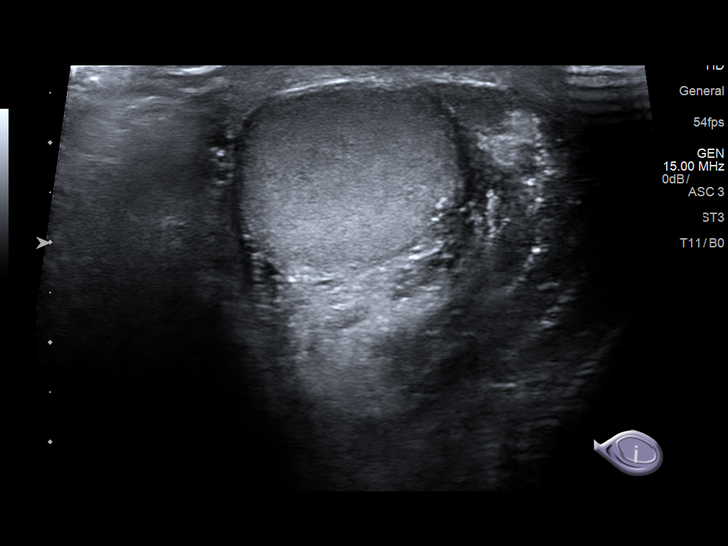
[im 27/72]
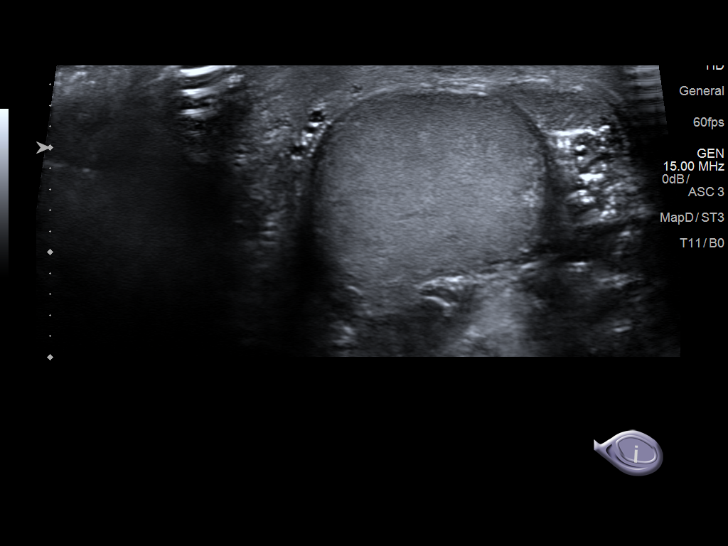
[im 33/72]
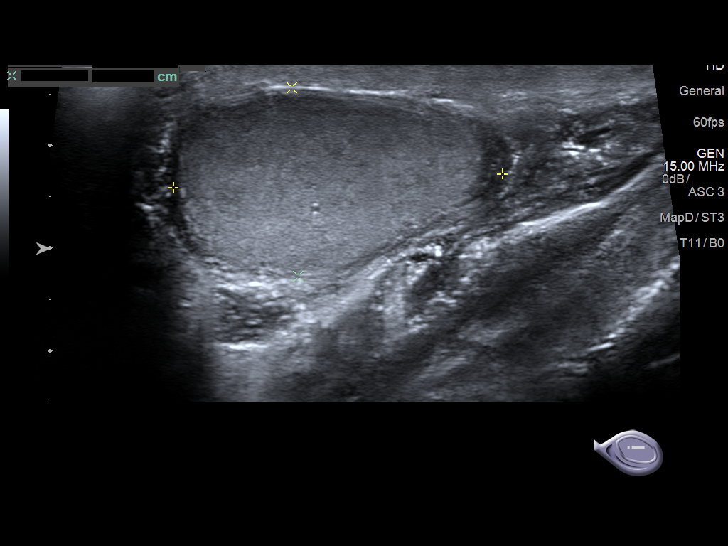
[im 39/72]
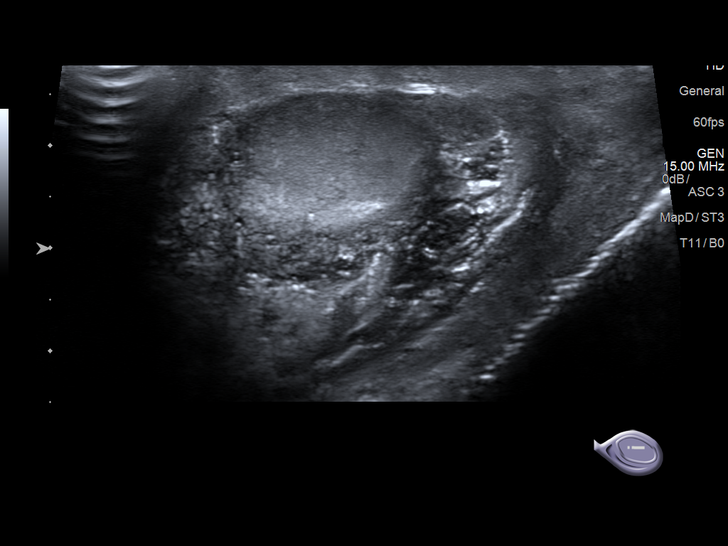
[im 45/72]
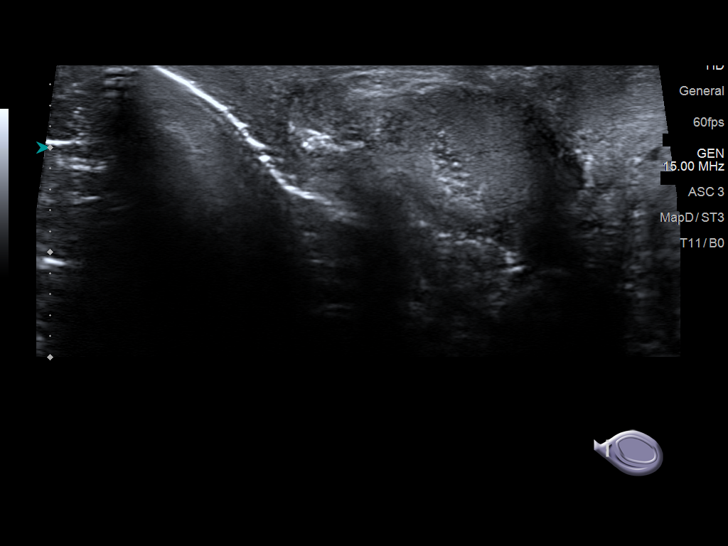
[im 48/72]
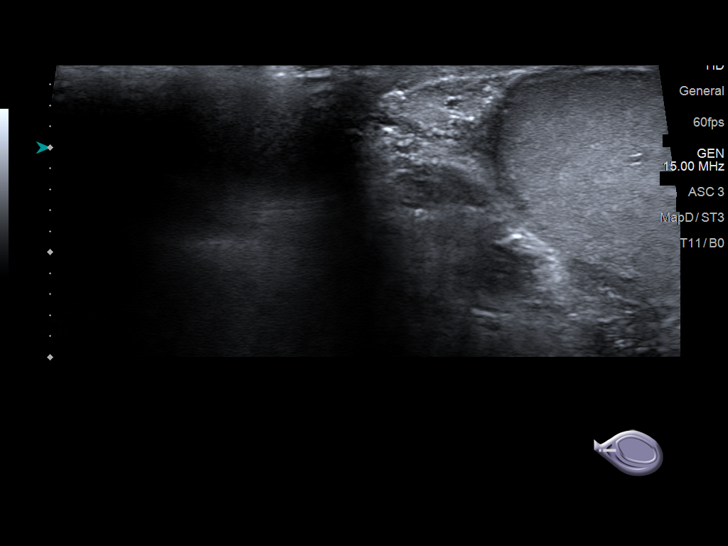
[im 54/72]
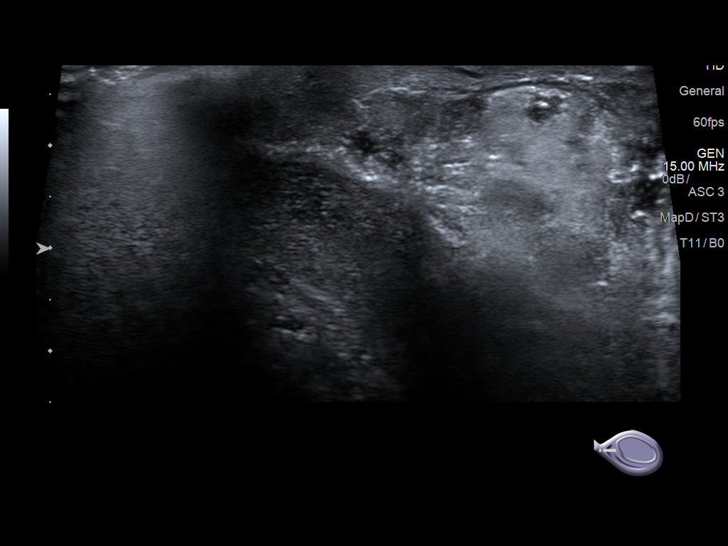
[im 60/72]
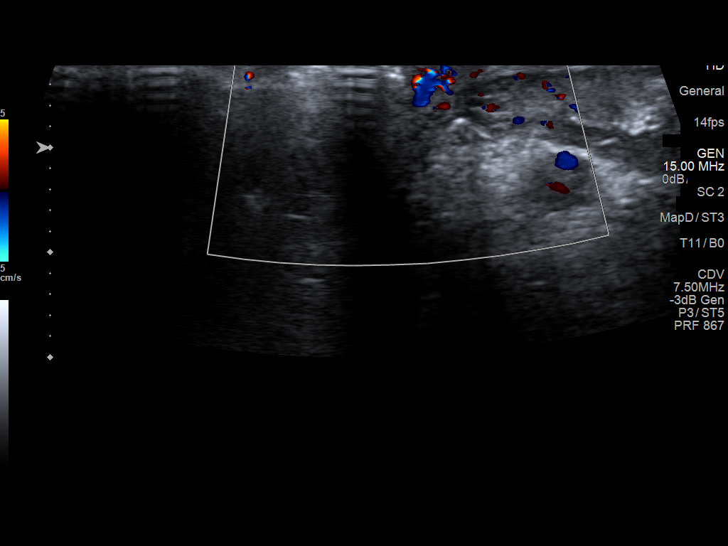
[im 66/72]
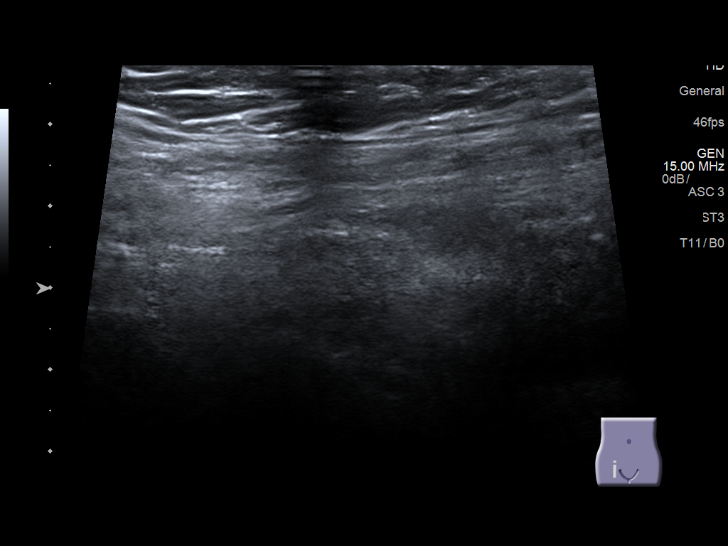
[im 72/72]
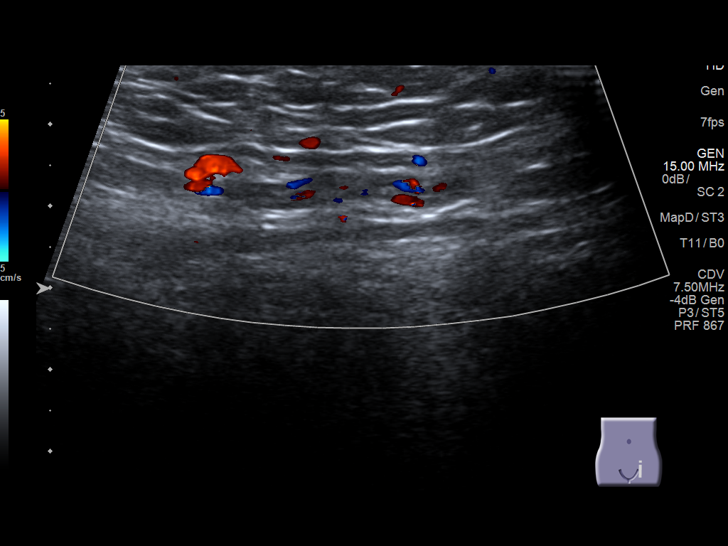

[14 of 25 positions shown; findings below may reference images not displayed]

FINDINGS: Right testicle

Measurements: 3.5 x 1.8 x 3.3 cm. No mass or microlithiasis
visualized.

Left testicle

Measurements: 3.2 x 1.8 x 2.2 cm. No mass or microlithiasis
visualized.

Right epididymis:  Normal in size and appearance.

Left epididymis: Enlarged with hyperemia compatible with
epididymitis.

Hydrocele:  None visualized.

Varicocele:  None visualized.

Pulsed Doppler interrogation of both testes demonstrates normal low
resistance arterial and venous waveforms bilaterally.
IMPRESSION: Enlarged, hyperemic left epididymis compatible with epididymitis.

No testicular abnormality.

## 2021-12-22 ENCOUNTER — Ambulatory Visit (INDEPENDENT_AMBULATORY_CARE_PROVIDER_SITE_OTHER): Payer: BC Managed Care – PPO

## 2021-12-22 ENCOUNTER — Ambulatory Visit (HOSPITAL_COMMUNITY)
Admission: EM | Admit: 2021-12-22 | Discharge: 2021-12-22 | Disposition: A | Discharge status: Home / Self Care | Payer: BC Managed Care – PPO | Attending: Family Medicine | Admitting: Family Medicine

## 2021-12-22 ENCOUNTER — Ambulatory Visit (HOSPITAL_COMMUNITY)
Admission: EM | Admit: 2021-12-22 | Discharge: 2021-12-22 | Discharge status: Absent without leave | Payer: BC Managed Care – PPO

## 2021-12-22 DIAGNOSIS — H5712 Ocular pain, left eye: Secondary | ICD-10-CM

## 2021-12-22 DIAGNOSIS — M25551 Pain in right hip: Secondary | ICD-10-CM

## 2021-12-22 MED ORDER — GENTAMICIN SULFATE 0.3 % OP SOLN: 2.0000 [drp] | Freq: Three times a day (TID) | OPHTHALMIC | 0 refills | Status: AC

## 2021-12-22 MED ORDER — IBUPROFEN 800 MG PO TABS: 800.0000 mg | ORAL_TABLET | Freq: Three times a day (TID) | ORAL | 0 refills | Status: DC | PRN

## 2021-12-22 NOTE — Discharge Instructions (Signed)
Your x-rays did not show any bony problem.  Take ibuprofen 800 mg--1 tab every 8 hours as needed for pain.  Put gentamicin drops in the left eye 3 times daily for 5 days.  Still call and get in with an ophthalmologist soon

## 2021-12-22 NOTE — ED Triage Notes (Signed)
Pt reports right eye pain and pressure x 1 month.  Pt reports right side hip pain radiating down to his leg.

## 2021-12-22 NOTE — ED Provider Notes (Signed)
[facility]    CSN: [id number] Arrival date & time: 12/22/21  1720      History   Chief Complaint Chief Complaint  Patient presents with   right leg pain   Eye Pain   Headache    HPI Andrew Lawrence is a 56 y.o. male.    Eye Pain Associated symptoms include headaches.  Headache Associated symptoms: eye pain    Here for left eye pain that is been going on for couple of months.  He has noticed that it would worsen when he bends over to pray.  Today it is red on his left eye and he is having reduced vision.   He is also having right hip pain that extends down to his foot.  That has been going on for about 2 months.  No trauma or fall  Past Medical History:  Diagnosis Date   Hypertension     There are no problems to display for this patient.   Past Surgical History:  Procedure Laterality Date   ABDOMINAL SURGERY         Home Medications    Prior to Admission medications   Medication Sig Start Date End Date Taking? Authorizing Provider  amLODipine (NORVASC) 10 MG tablet Take by mouth. 09/08/21  Yes [provider]  gentamicin (GARAMYCIN) 0.3 % ophthalmic solution Place 2 drops into the left eye 3 (three) times daily for 5 days. 12/22/21 12/27/21 Yes Zenia Resides, MD  ibuprofen (ADVIL) 800 MG tablet Take 1 tablet (800 mg total) by mouth every 8 (eight) hours as needed (pain). 12/22/21  Yes Zenia Resides, MD  famotidine (PEPCID) 20 MG tablet Take 1 tablet (20 mg total) by mouth 2 (two) times daily. Patient taking differently: Take 20 mg by mouth daily as needed for heartburn.  01/16/14   Garlon Hatchet, PA-C    Family History No family history on file.  Social History Social History   Tobacco Use   Smoking status: Never   Smokeless tobacco: Never  Substance Use Topics   Alcohol use: No   Drug use: No     Allergies   Patient has no known allergies.   Review of Systems Review of Systems  Eyes:  Positive for pain.   Neurological:  Positive for headaches.     Physical Exam Triage Vital Signs ED Triage Vitals [12/22/21 1755]  Enc Vitals Group     BP (!) 151/95     Pulse Rate 73     Resp 18     Temp 98.4 F (36.9 C)     Temp Source Oral     SpO2 98 %     Weight      Height      Head Circumference      Peak Flow      Pain Score      Pain Loc      Pain Edu?      Excl. in GC?    No data found.  Updated Vital Signs BP (!) 151/95 (BP Location: Left Arm)   Pulse 73   Temp 98.4 F (36.9 C) (Oral)   Resp 18   SpO2 98%   Visual Acuity Right Eye Distance: 20/40 with glasses Left Eye Distance: 20/30 with glasses Bilateral Distance: 20/20 with glasses  Right Eye Near:   Left Eye Near:    Bilateral Near:     Physical Exam Vitals reviewed.  Constitutional:      General: He  is not in acute distress.    Appearance: He is not toxic-appearing.  HENT:     Nose: Nose normal.  Eyes:     Extraocular Movements: Extraocular movements intact.     Pupils: Pupils are equal, round, and reactive to light.     Comments: There is mild injection of the left eye.  Cardiovascular:     Rate and Rhythm: Normal rate and regular rhythm.     Heart sounds: No murmur heard. Pulmonary:     Effort: Pulmonary effort is normal.     Breath sounds: Normal breath sounds.  Musculoskeletal:     Cervical back: Neck supple.  Lymphadenopathy:     Cervical: No cervical adenopathy.  Skin:    Capillary Refill: Capillary refill takes less than 2 seconds.     Coloration: Skin is not jaundiced or pale.  Neurological:     General: No focal deficit present.     Mental Status: He is alert and oriented to person, place, and time.  Psychiatric:        Behavior: Behavior normal.      UC Treatments / Results  Labs (all labs ordered are listed, but only abnormal results are displayed) Labs Reviewed - No data to display  EKG   Radiology DG Hip Unilat With Pelvis 2-3 Views Right  Result Date:  12/22/2021 CLINICAL DATA:  Right hip and leg pain EXAM: DG HIP (WITH OR WITHOUT PELVIS) 2-3V RIGHT COMPARISON:  None Available. FINDINGS: There is no evidence of hip fracture or dislocation. There is no evidence of arthropathy or other focal bone abnormality. IMPRESSION: Negative. Electronically Signed   By: Franchot Gallo M.D.   On: 12/22/2021 18:55    Procedures Procedures (including critical care time)  Medications Ordered in UC Medications - No data to display  Initial Impression / Assessment and Plan / UC Course  I have reviewed the triage vital signs and the nursing notes.  Pertinent labs & imaging results that were available during my care of the patient were reviewed by me and considered in my medical decision making (see chart for details).       Visual acuity is 20/30 in his right eye and 20/40 in his left eye.  He is asked to call ophthalmology in the morning when they are open.   X-rays negative of the right hip.  Pain relief provided and I have asked him to establish with a PCP  He is given contact information for ophthalmology Final Clinical Impressions(s) / UC Diagnoses   Final diagnoses:  Left eye pain  Lateral pain of right hip     Discharge Instructions      Your x-rays did not show any bony problem.  Take ibuprofen 800 mg--1 tab every 8 hours as needed for pain.  Put gentamicin drops in the left eye 3 times daily for 5 days.  Still call and get in with an ophthalmologist soon     ED Prescriptions     Medication Sig Dispense Auth. Provider   ibuprofen (ADVIL) 800 MG tablet Take 1 tablet (800 mg total) by mouth every 8 (eight) hours as needed (pain). 21 tablet Timmia Cogburn, Gwenlyn Perking, MD   gentamicin (GARAMYCIN) 0.3 % ophthalmic solution Place 2 drops into the left eye 3 (three) times daily for 5 days. 5 mL Barrett Henle, MD      PDMP not reviewed this encounter.   Barrett Henle, MD 12/22/21 [phone]

## 2022-02-17 ENCOUNTER — Emergency Department (HOSPITAL_COMMUNITY)
Admission: EM | Admit: 2022-02-17 | Discharge: 2022-02-17 | Disposition: A | Discharge status: Other Acute Inpatient Hospital | Payer: Worker's Compensation | Attending: Emergency Medicine | Admitting: Emergency Medicine

## 2022-02-17 ENCOUNTER — Other Ambulatory Visit: Payer: Self-pay

## 2022-02-17 DIAGNOSIS — T300 Burn of unspecified body region, unspecified degree: Secondary | ICD-10-CM

## 2022-02-17 DIAGNOSIS — Z23 Encounter for immunization: Secondary | ICD-10-CM | POA: Diagnosis not present

## 2022-02-17 DIAGNOSIS — T2020XA Burn of second degree of head, face, and neck, unspecified site, initial encounter: Secondary | ICD-10-CM | POA: Insufficient documentation

## 2022-02-17 DIAGNOSIS — X19XXXA Contact with other heat and hot substances, initial encounter: Secondary | ICD-10-CM | POA: Diagnosis not present

## 2022-02-17 DIAGNOSIS — T2000XA Burn of unspecified degree of head, face, and neck, unspecified site, initial encounter: Secondary | ICD-10-CM

## 2022-02-17 HISTORY — DX: Burn of unspecified body region, unspecified degree: T30.0

## 2022-02-17 LAB — COMPREHENSIVE METABOLIC PANEL
ALT: 55 U/L — ABNORMAL HIGH (ref 0–44)
AST: 38 U/L (ref 15–41)
Albumin: 3.9 g/dL (ref 3.5–5.0)
Alkaline Phosphatase: 67 U/L (ref 38–126)
Anion gap: 9 (ref 5–15)
BUN: 13 mg/dL (ref 6–20)
CO2: 25 mmol/L (ref 22–32)
Calcium: 9.3 mg/dL (ref 8.9–10.3)
Chloride: 106 mmol/L (ref 98–111)
Creatinine, Ser: 1.38 mg/dL — ABNORMAL HIGH (ref 0.61–1.24)
GFR, Estimated: 59 mL/min — ABNORMAL LOW (ref >60)
Glucose, Bld: 124 mg/dL — ABNORMAL HIGH (ref 70–99)
Potassium: 3.2 mmol/L — ABNORMAL LOW (ref 3.5–5.1)
Sodium: 140 mmol/L (ref 135–145)
Total Bilirubin: 1.1 mg/dL (ref 0.3–1.2)
Total Protein: 7.3 g/dL (ref 6.5–8.1)

## 2022-02-17 LAB — CBC WITH DIFFERENTIAL/PLATELET
Abs Immature Granulocytes: 0.04 10*3/uL (ref 0.00–0.07)
Basophils Absolute: 0.1 10*3/uL (ref 0.0–0.1)
Basophils Relative: 1 %
Eosinophils Absolute: 0.2 10*3/uL (ref 0.0–0.5)
Eosinophils Relative: 2 %
HCT: 38.5 % — ABNORMAL LOW (ref 39.0–52.0)
Hemoglobin: 13.3 g/dL (ref 13.0–17.0)
Immature Granulocytes: 0 %
Lymphocytes Relative: 29 %
Lymphs Abs: 2.8 10*3/uL (ref 0.7–4.0)
MCH: 29 pg (ref 26.0–34.0)
MCHC: 34.5 g/dL (ref 30.0–36.0)
MCV: 83.9 fL (ref 80.0–100.0)
Monocytes Absolute: 0.9 10*3/uL (ref 0.1–1.0)
Monocytes Relative: 9 %
Neutro Abs: 5.8 10*3/uL (ref 1.7–7.7)
Neutrophils Relative %: 59 %
Platelets: 238 10*3/uL (ref 150–400)
RBC: 4.59 MIL/uL (ref 4.22–5.81)
RDW: 13.3 % (ref 11.5–15.5)
WBC: 9.7 10*3/uL (ref 4.0–10.5)
nRBC: 0 % (ref 0.0–0.2)

## 2022-02-17 MED ORDER — ACETAMINOPHEN 500 MG PO TABS
1000.0000 mg | ORAL_TABLET | Freq: Once | ORAL | Status: AC
  Administered 2022-02-17: 1000 mg via ORAL
  Filled 2022-02-17: qty 2

## 2022-02-17 MED ORDER — FENTANYL CITRATE PF 50 MCG/ML IJ SOSY
50.0000 ug | PREFILLED_SYRINGE | Freq: Once | INTRAMUSCULAR | Status: AC
  Administered 2022-02-17: 50 ug via INTRAVENOUS
  Filled 2022-02-17: qty 1

## 2022-02-17 MED ORDER — LACTATED RINGERS IV BOLUS
500.0000 mL | Freq: Once | INTRAVENOUS | Status: AC
  Administered 2022-02-17: 500 mL via INTRAVENOUS

## 2022-02-17 MED ORDER — TETANUS-DIPHTH-ACELL PERTUSSIS 5-2.5-18.5 LF-MCG/0.5 IM SUSY
0.5000 mL | PREFILLED_SYRINGE | Freq: Once | INTRAMUSCULAR | Status: AC
  Administered 2022-02-17: 0.5 mL via INTRAMUSCULAR
  Filled 2022-02-17: qty 0.5

## 2022-02-17 MED ORDER — ONDANSETRON HCL 4 MG/2ML IJ SOLN
4.0000 mg | Freq: Once | INTRAMUSCULAR | Status: AC
  Administered 2022-02-17: 4 mg via INTRAVENOUS
  Filled 2022-02-17: qty 2
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
## 2022-02-17 NOTE — ED Notes (Signed)
Per [employer], leave IV in
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
## 2022-06-15 ENCOUNTER — Other Ambulatory Visit: Payer: Self-pay

## 2022-06-15 ENCOUNTER — Encounter (HOSPITAL_COMMUNITY): Payer: Self-pay

## 2022-06-15 ENCOUNTER — Emergency Department (HOSPITAL_COMMUNITY): Payer: Medicaid Other

## 2022-06-15 ENCOUNTER — Inpatient Hospital Stay (HOSPITAL_COMMUNITY): Payer: Medicaid Other

## 2022-06-15 ENCOUNTER — Inpatient Hospital Stay (HOSPITAL_COMMUNITY)
Admission: EM | Admit: 2022-06-15 | Discharge: 2022-06-19 | DRG: 055 | Disposition: A | Discharge status: Home / Self Care | Payer: Medicaid Other | Attending: Neurosurgery | Admitting: Neurosurgery

## 2022-06-15 DIAGNOSIS — D329 Benign neoplasm of meninges, unspecified: Secondary | ICD-10-CM | POA: Diagnosis present

## 2022-06-15 DIAGNOSIS — Z79899 Other long term (current) drug therapy: Secondary | ICD-10-CM

## 2022-06-15 DIAGNOSIS — R569 Unspecified convulsions: Secondary | ICD-10-CM | POA: Diagnosis present

## 2022-06-15 DIAGNOSIS — R4182 Altered mental status, unspecified: Secondary | ICD-10-CM | POA: Diagnosis present

## 2022-06-15 DIAGNOSIS — S0990XA Unspecified injury of head, initial encounter: Secondary | ICD-10-CM | POA: Diagnosis present

## 2022-06-15 DIAGNOSIS — G939 Disorder of brain, unspecified: Secondary | ICD-10-CM | POA: Diagnosis not present

## 2022-06-15 DIAGNOSIS — I10 Essential (primary) hypertension: Secondary | ICD-10-CM | POA: Diagnosis present

## 2022-06-15 DIAGNOSIS — D320 Benign neoplasm of cerebral meninges: Secondary | ICD-10-CM | POA: Diagnosis not present

## 2022-06-15 DIAGNOSIS — G9389 Other specified disorders of brain: Secondary | ICD-10-CM

## 2022-06-15 DIAGNOSIS — W19XXXA Unspecified fall, initial encounter: Secondary | ICD-10-CM | POA: Diagnosis present

## 2022-06-15 DIAGNOSIS — Y92002 Bathroom of unspecified non-institutional (private) residence single-family (private) house as the place of occurrence of the external cause: Secondary | ICD-10-CM

## 2022-06-15 DIAGNOSIS — G936 Cerebral edema: Secondary | ICD-10-CM | POA: Diagnosis not present

## 2022-06-15 LAB — I-STAT VENOUS BLOOD GAS, ED
Acid-Base Excess: 2 mmol/L (ref 0.0–2.0)
Bicarbonate: 27.1 mmol/L (ref 20.0–28.0)
Calcium, Ion: 1.17 mmol/L (ref 1.15–1.40)
HCT: 43 % (ref 39.0–52.0)
Hemoglobin: 14.6 g/dL (ref 13.0–17.0)
O2 Saturation: 99 %
Potassium: 3.2 mmol/L — ABNORMAL LOW (ref 3.5–5.1)
Sodium: 137 mmol/L (ref 135–145)
TCO2: 28 mmol/L (ref 22–32)
pCO2, Ven: 41.1 mmHg — ABNORMAL LOW (ref 44–60)
pH, Ven: 7.428 (ref 7.25–7.43)
pO2, Ven: 137 mmHg — ABNORMAL HIGH (ref 32–45)

## 2022-06-15 LAB — SAMPLE TO BLOOD BANK

## 2022-06-15 LAB — I-STAT CHEM 8, ED
BUN: 10 mg/dL (ref 6–20)
Calcium, Ion: 1.16 mmol/L (ref 1.15–1.40)
Chloride: 101 mmol/L (ref 98–111)
Creatinine, Ser: 0.8 mg/dL (ref 0.61–1.24)
Glucose, Bld: 130 mg/dL — ABNORMAL HIGH (ref 70–99)
HCT: 44 % (ref 39.0–52.0)
Hemoglobin: 15 g/dL (ref 13.0–17.0)
Potassium: 3.2 mmol/L — ABNORMAL LOW (ref 3.5–5.1)
Sodium: 137 mmol/L (ref 135–145)
TCO2: 27 mmol/L (ref 22–32)

## 2022-06-15 LAB — BRAIN NATRIURETIC PEPTIDE: B Natriuretic Peptide: 9.6 pg/mL (ref 0.0–100.0)

## 2022-06-15 LAB — COMPREHENSIVE METABOLIC PANEL
ALT: 50 U/L — ABNORMAL HIGH (ref 0–44)
AST: 37 U/L (ref 15–41)
Albumin: 4.1 g/dL (ref 3.5–5.0)
Alkaline Phosphatase: 54 U/L (ref 38–126)
Anion gap: 15 (ref 5–15)
BUN: 10 mg/dL (ref 6–20)
CO2: 22 mmol/L (ref 22–32)
Calcium: 9.8 mg/dL (ref 8.9–10.3)
Chloride: 99 mmol/L (ref 98–111)
Creatinine, Ser: 0.91 mg/dL (ref 0.61–1.24)
GFR, Estimated: >60 mL/min (ref >60)
Glucose, Bld: 130 mg/dL — ABNORMAL HIGH (ref 70–99)
Potassium: 3.3 mmol/L — ABNORMAL LOW (ref 3.5–5.1)
Sodium: 136 mmol/L (ref 135–145)
Total Bilirubin: 1.9 mg/dL — ABNORMAL HIGH (ref 0.3–1.2)
Total Protein: 7.5 g/dL (ref 6.5–8.1)

## 2022-06-15 LAB — TROPONIN I (HIGH SENSITIVITY)
Troponin I (High Sensitivity): 29 ng/L — ABNORMAL HIGH (ref <18)
Troponin I (High Sensitivity): 38 ng/L — ABNORMAL HIGH (ref <18)

## 2022-06-15 LAB — CBC
HCT: 42.2 % (ref 39.0–52.0)
Hemoglobin: 14.5 g/dL (ref 13.0–17.0)
MCH: 28.1 pg (ref 26.0–34.0)
MCHC: 34.4 g/dL (ref 30.0–36.0)
MCV: 81.8 fL (ref 80.0–100.0)
Platelets: 224 10*3/uL (ref 150–400)
RBC: 5.16 MIL/uL (ref 4.22–5.81)
RDW: 13.7 % (ref 11.5–15.5)
WBC: 6.6 10*3/uL (ref 4.0–10.5)
nRBC: 0 % (ref 0.0–0.2)

## 2022-06-15 LAB — ETHANOL: Alcohol, Ethyl (B): <10 mg/dL (ref <10)

## 2022-06-15 LAB — ACETAMINOPHEN LEVEL: Acetaminophen (Tylenol), Serum: <10 ug/mL — ABNORMAL LOW (ref 10–30)

## 2022-06-15 LAB — LACTIC ACID, PLASMA: Lactic Acid, Venous: 2.2 mmol/L (ref 0.5–1.9)

## 2022-06-15 LAB — SALICYLATE LEVEL: Salicylate Lvl: <7 mg/dL — ABNORMAL LOW (ref 7.0–30.0)

## 2022-06-15 LAB — PROTIME-INR
INR: 1.1 (ref 0.8–1.2)
Prothrombin Time: 13.6 seconds (ref 11.4–15.2)

## 2022-06-15 LAB — CK: Total CK: 374 U/L (ref 49–397)

## 2022-06-15 LAB — HIV ANTIBODY (ROUTINE TESTING W REFLEX): HIV Screen 4th Generation wRfx: NONREACTIVE

## 2022-06-15 LAB — AMMONIA: Ammonia: 52 umol/L — ABNORMAL HIGH (ref 9–35)

## 2022-06-15 MED ORDER — ONDANSETRON HCL 4 MG PO TABS: 4.0000 mg | ORAL_TABLET | Freq: Four times a day (QID) | ORAL | Status: DC | PRN

## 2022-06-15 MED ORDER — ONDANSETRON HCL 4 MG/2ML IJ SOLN: 4.0000 mg | Freq: Four times a day (QID) | INTRAMUSCULAR | Status: DC | PRN

## 2022-06-15 MED ORDER — SODIUM CHLORIDE 0.9% FLUSH: 3.0000 mL | INTRAVENOUS | Status: DC | PRN

## 2022-06-15 MED ORDER — DIPHENHYDRAMINE HCL 50 MG/ML IJ SOLN
25.0000 mg | Freq: Once | INTRAMUSCULAR | Status: AC
  Administered 2022-06-15: 25 mg via INTRAMUSCULAR
  Filled 2022-06-15: qty 1

## 2022-06-15 MED ORDER — ACETAMINOPHEN 325 MG PO TABS
650.0000 mg | ORAL_TABLET | ORAL | Status: DC | PRN
  Administered 2022-06-17: 650 mg via ORAL
  Filled 2022-06-15: qty 2

## 2022-06-15 MED ORDER — AMLODIPINE BESYLATE 10 MG PO TABS
10.0000 mg | ORAL_TABLET | Freq: Every day | ORAL | Status: DC
  Administered 2022-06-17 – 2022-06-19 (×3): 10 mg via ORAL
  Filled 2022-06-15 (×4): qty 1

## 2022-06-15 MED ORDER — LEVETIRACETAM IN NACL 1500 MG/100ML IV SOLN
1500.0000 mg | Freq: Once | INTRAVENOUS | Status: AC
  Administered 2022-06-15: 1500 mg via INTRAVENOUS
  Filled 2022-06-15: qty 100

## 2022-06-15 MED ORDER — MIDAZOLAM HCL (PF) 10 MG/2ML IJ SOLN: 5.0000 mg | Freq: Once | INTRAMUSCULAR | Status: AC

## 2022-06-15 MED ORDER — SODIUM CHLORIDE 0.9 % IV BOLUS
1000.0000 mL | Freq: Once | INTRAVENOUS | Status: AC
  Administered 2022-06-15: 1000 mL via INTRAVENOUS

## 2022-06-15 MED ORDER — HALOPERIDOL LACTATE 5 MG/ML IJ SOLN
5.0000 mg | Freq: Once | INTRAMUSCULAR | Status: AC
  Administered 2022-06-15: 5 mg via INTRAMUSCULAR
  Filled 2022-06-15: qty 1

## 2022-06-15 MED ORDER — MIDAZOLAM HCL (PF) 10 MG/2ML IJ SOLN
INTRAMUSCULAR | Status: AC
  Administered 2022-06-15: 5 mg via INTRAMUSCULAR
  Filled 2022-06-15: qty 2

## 2022-06-15 MED ORDER — DEXAMETHASONE SODIUM PHOSPHATE 10 MG/ML IJ SOLN
10.0000 mg | Freq: Once | INTRAMUSCULAR | Status: AC
  Administered 2022-06-15: 10 mg via INTRAVENOUS
  Filled 2022-06-15: qty 1

## 2022-06-15 MED ORDER — SODIUM CHLORIDE 0.9% FLUSH
3.0000 mL | Freq: Two times a day (BID) | INTRAVENOUS | Status: DC
  Administered 2022-06-15 – 2022-06-19 (×8): 3 mL via INTRAVENOUS

## 2022-06-15 MED ORDER — SODIUM CHLORIDE 0.9 % IV SOLN: 250.0000 mL | INTRAVENOUS | Status: DC | PRN

## 2022-06-15 MED ORDER — HEPARIN SODIUM (PORCINE) 5000 UNIT/ML IJ SOLN
5000.0000 [IU] | Freq: Three times a day (TID) | INTRAMUSCULAR | Status: DC
  Administered 2022-06-15 – 2022-06-19 (×11): 5000 [IU] via SUBCUTANEOUS
  Filled 2022-06-15 (×11): qty 1

## 2022-06-15 MED ORDER — ONDANSETRON HCL 4 MG/2ML IJ SOLN
4.0000 mg | Freq: Once | INTRAMUSCULAR | Status: AC
  Administered 2022-06-15: 4 mg via INTRAVENOUS
  Filled 2022-06-15: qty 2

## 2022-06-15 NOTE — Progress Notes (Addendum)
Date and time results received: 06/15/22 1925  Test: Lactic Acid Critical Value: 2.2  Name of Provider Notified: Conchita Paris, MD  Orders Received? Or Actions Taken?: MD notified
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
## 2022-06-15 NOTE — ED Notes (Signed)
ED TO INPATIENT HANDOFF REPORT  ED Nurse Name and Phone #: [phone]  S Name/Age/Gender Andrew Lawrence 57 y.o. male Room/Bed: 042C/042C  Code Status   Code Status: Full Code  Home/SNF/Other Home Patient oriented to: self, place, and time Is this baseline? No   Triage Complete: Triage complete  Chief Complaint Meningioma [D32.9]  Triage Note Pt to ED via EMS from home c/o combative post fall. EMS initally thought pt may have seized, per pts wife at bedside pt was found in room on floor after they heard a loud bang. Pts wife stated pt was not responding and she called EMS. EMS stated upon their arrival pt was alert but very combative, they gave 5mg  of versed IM PTA. Pt arrives to ED responsive to voice, GCS of 11 O2 sats 98% on NRB. ED provider called to bedside, 1138 ED provider upgraded pt to level 2 due to criteria.     Allergies No Known Allergies  Level of Care/Admitting Diagnosis ED Disposition     ED Disposition  Admit   Condition  --   Comment  Hospital Area: [facility] [[id number]]  Level of Care: Med-Surg [16]  May admit patient to [facility] or [facility] if equivalent level of care is available:: No  Covid Evaluation: Asymptomatic - no recent exposure (last 10 days) testing not required  Diagnosis: Meningioma [960454]  Admitting Physician: Lisbeth Renshaw [[id number]]  Attending Physician: Lisbeth Renshaw [[id number]]  Bed request comments: 4NP  Certification:: I certify this patient will need inpatient services for at least 2 midnights  Estimated Length of Stay: 7          B Medical/Surgery History Past Medical History:  Diagnosis Date   Hypertension    Past Surgical History:  Procedure Laterality Date   ABDOMINAL SURGERY       A IV Location/Drains/Wounds Patient Lines/Drains/Airways Status     Active Line/Drains/Airways     Name Placement date Placement time Site Days   Peripheral IV 02/17/22 20 G Anterior;Right Hand  02/17/22  0840  Hand  118   Peripheral IV 06/15/22 20 G Anterior;Right Hand 06/15/22  1225  Hand  less than 1            Intake/Output Last 24 hours No intake or output data in the 24 hours ending 06/15/22 1643  Labs/Imaging Results for orders placed or performed during the hospital encounter of 06/15/22 (from the past 48 hour(s))  Comprehensive metabolic panel     Status: Abnormal   Collection Time: 06/15/22 12:20 PM  Result Value Ref Range   Sodium 136 135 - 145 mmol/L   Potassium 3.3 (L) 3.5 - 5.1 mmol/L   Chloride 99 98 - 111 mmol/L   CO2 22 22 - 32 mmol/L   Glucose, Bld 130 (H) 70 - 99 mg/dL    Comment: Glucose reference range applies only to samples taken after fasting for at least 8 hours.   BUN 10 6 - 20 mg/dL   Creatinine, Ser 2.13 0.61 - 1.24 mg/dL   Calcium 9.8 8.9 - 08.6 mg/dL   Total Protein 7.5 6.5 - 8.1 g/dL   Albumin 4.1 3.5 - 5.0 g/dL   AST 37 15 - 41 U/L   ALT 50 (H) 0 - 44 U/L   Alkaline Phosphatase 54 38 - 126 U/L   Total Bilirubin 1.9 (H) 0.3 - 1.2 mg/dL   GFR, Estimated >57 >84 mL/min    Comment: (NOTE) Calculated using the CKD-EPI Creatinine  Equation (2021)    Anion gap 15 5 - 15    Comment: Performed at [facility], [address], [place], [place] [address]  CBC     Status: None   Collection Time: 06/15/22 12:20 PM  Result Value Ref Range   WBC 6.6 4.0 - 10.5 K/uL   RBC 5.16 4.22 - 5.81 MIL/uL   Hemoglobin 14.5 13.0 - 17.0 g/dL   HCT 28.4 13.2 - 44.0 %   MCV 81.8 80.0 - 100.0 fL   MCH 28.1 26.0 - 34.0 pg   MCHC 34.4 30.0 - 36.0 g/dL   RDW 10.2 72.5 - 36.6 %   Platelets 224 150 - 400 K/uL   nRBC 0.0 0.0 - 0.2 %    Comment: Performed at [facility], [address] [place], [place] [address]  Ethanol     Status: None   Collection Time: 06/15/22 12:20 PM  Result Value Ref Range   Alcohol, Ethyl (B) <10 <10 mg/dL    Comment: (NOTE) Lowest detectable limit for serum alcohol is 10 mg/dL.  For medical purposes only. Performed  at [facility], [address], [place], [place] [address]   Protime-INR     Status: None   Collection Time: 06/15/22 12:20 PM  Result Value Ref Range   Prothrombin Time 13.6 11.4 - 15.2 seconds   INR 1.1 0.8 - 1.2    Comment: (NOTE) INR goal varies based on device and disease states. Performed at [facility], [address] [place], [place] [address]   Sample to Blood Bank     Status: None   Collection Time: 06/15/22 12:20 PM  Result Value Ref Range   Blood Bank Specimen SAMPLE AVAILABLE FOR TESTING    Sample Expiration      06/18/2022,2359 Performed at [facility], [address], [place], [place] [address]   CK     Status: None   Collection Time: 06/15/22 12:20 PM  Result Value Ref Range   Total CK 374 49 - 397 U/L    Comment: Performed at [facility], [address], [place], [place] [address]  Salicylate level     Status: Abnormal   Collection Time: 06/15/22 12:20 PM  Result Value Ref Range   Salicylate Lvl <7.0 (L) 7.0 - 30.0 mg/dL    Comment: Performed at [facility], [address], [place], [place] [address]  Acetaminophen level     Status: Abnormal   Collection Time: 06/15/22 12:20 PM  Result Value Ref Range   Acetaminophen (Tylenol), Serum <10 (L) 10 - 30 ug/mL    Comment: (NOTE) Therapeutic concentrations vary significantly. A range of 10-30 ug/mL  may be an effective concentration for many patients. However, some  are best treated at concentrations outside of this range. Acetaminophen concentrations >150 ug/mL at 4 hours after ingestion  and >50 ug/mL at 12 hours after ingestion are often associated with  toxic reactions.  Performed at [facility], [address], [place], [place] [address]   Ammonia     Status: Abnormal   Collection Time: 06/15/22 12:20 PM  Result Value Ref Range   Ammonia 52 (H) 9 - 35 umol/L    Comment: Performed at [facility], [address] [place], [place] [address]  Troponin I  (High Sensitivity)     Status: Abnormal   Collection Time: 06/15/22 12:20 PM  Result Value Ref Range  Troponin I (High Sensitivity) 29 (H) <18 ng/L    Comment: (NOTE) Elevated high sensitivity troponin I (hsTnI) values and significant  changes across serial measurements may suggest ACS but many other  chronic and acute conditions are known to elevate hsTnI results.  Refer to the "Links" section for chest pain algorithms and additional  guidance. Performed at [facility], [address] [place], [place] [address]   Brain natriuretic peptide     Status: None   Collection Time: 06/15/22 12:20 PM  Result Value Ref Range   B Natriuretic Peptide 9.6 0.0 - 100.0 pg/mL    Comment: Performed at [facility], [address] [place], [place] [address]  I-Stat Chem 8, ED     Status: Abnormal   Collection Time: 06/15/22  1:29 PM  Result Value Ref Range   Sodium 137 135 - 145 mmol/L   Potassium 3.2 (L) 3.5 - 5.1 mmol/L   Chloride 101 98 - 111 mmol/L   BUN 10 6 - 20 mg/dL   Creatinine, Ser 3.14 0.61 - 1.24 mg/dL   Glucose, Bld 388 (H) 70 - 99 mg/dL    Comment: Glucose reference range applies only to samples taken after fasting for at least 8 hours.   Calcium, Ion 1.16 1.15 - 1.40 mmol/L   TCO2 27 22 - 32 mmol/L   Hemoglobin 15.0 13.0 - 17.0 g/dL   HCT 87.5 79.7 - 28.2 %  I-Stat venous blood gas, ([facility] ED, [facility], [facility])     Status: Abnormal   Collection Time: 06/15/22  1:29 PM  Result Value Ref Range   pH, Ven 7.428 7.25 - 7.43   pCO2, Ven 41.1 (L) 44 - 60 mmHg   pO2, Ven 137 (H) 32 - 45 mmHg   Bicarbonate 27.1 20.0 - 28.0 mmol/L   TCO2 28 22 - 32 mmol/L   O2 Saturation 99 %   Acid-Base Excess 2.0 0.0 - 2.0 mmol/L   Sodium 137 135 - 145 mmol/L   Potassium 3.2 (L) 3.5 - 5.1 mmol/L   Calcium, Ion 1.17 1.15 - 1.40 mmol/L   HCT 43.0 39.0 - 52.0 %   Hemoglobin 14.6 13.0 - 17.0 g/dL   Sample type VENOUS    DG Chest Port 1 View  Result Date: 06/15/2022 CLINICAL DATA:  Trauma.  EXAM: PORTABLE CHEST 1 VIEW COMPARISON:  January 16, 2014. FINDINGS: Stable cardiomediastinal silhouette. Right lung is clear. Minimal left basilar subsegmental atelectasis. Bony thorax is unremarkable. IMPRESSION: Minimal left basilar subsegmental atelectasis. Electronically Signed   By: Lupita Raider M.D.   On: 06/15/2022 12:20   CT HEAD WO CONTRAST  Result Date: 06/15/2022 CLINICAL DATA:  Facial trauma, blunt; Head trauma, moderate-severe; Polytrauma, blunt EXAM: CT HEAD WITHOUT CONTRAST CT MAXILLOFACIAL WITHOUT CONTRAST CT CERVICAL SPINE WITHOUT CONTRAST TECHNIQUE: Multidetector CT imaging of the head, cervical spine, and maxillofacial structures were performed using the standard protocol without intravenous contrast. Multiplanar CT image reconstructions of the cervical spine and maxillofacial structures were also generated. RADIATION DOSE REDUCTION: This exam was performed according to the departmental dose-optimization program which includes automated exposure control, adjustment of the mA and/or kV according to patient size and/or use of iterative reconstruction technique. COMPARISON:  May 02, 2011. FINDINGS: CT HEAD FINDINGS Brain: Large (approximately 4.5 cm) likely extra-axial mass along the left sphenoid wing. There is mass effect on the adjacent anterior temporal lobe with brain edema and approximately 3 mm rightward midline shift. No evidence of acute hemorrhage or  acute large vascular territory infarct. No hydrocephalus. Mild effacement of the left lateral ventricle due to above mass. Vascular: No hyperdense vessel. Skull: No acute fracture. CT MAXILLOFACIAL FINDINGS Osseous: No fracture or mandibular dislocation. No destructive process. Orbits: Negative. No traumatic or inflammatory finding. Sinuses: Inferior maxillary sinus mucosal thickening and probable retention cysts. Soft tissues: Left periorbital contusion. CT CERVICAL SPINE FINDINGS Alignment: Straightening.  No substantial  sagittal subluxation. Skull base and vertebrae: No acute fracture. Vertebral body heights are maintained. Soft tissues and spinal canal: No prevertebral fluid or swelling. No visible canal hematoma. Disc levels: Multilevel degenerative disc disease including bridging osteophytes and endplate spurring. Upper chest: Visualized lung apices are clear. IMPRESSION: 1. Large (approximately 4.5 cm) likely extra-axial mass along the left sphenoid wing, possibly a meningioma but incompletely assessed on this noncontrast head CT. There is mass effect on the adjacent anterior temporal lobe with brain edema and approximately 3 mm rightward midline shift. Recommend MRI head with contrast and neurosurgical consultation. 2. No acute traumatic intracranial or cervical spine findings. No facial fracture. Electronically Signed   By: Feliberto HartsFrederick S Jones M.D.   On: 06/15/2022 12:17   CT CERVICAL SPINE WO CONTRAST  Result Date: 06/15/2022 CLINICAL DATA:  Facial trauma, blunt; Head trauma, moderate-severe; Polytrauma, blunt EXAM: CT HEAD WITHOUT CONTRAST CT MAXILLOFACIAL WITHOUT CONTRAST CT CERVICAL SPINE WITHOUT CONTRAST TECHNIQUE: Multidetector CT imaging of the head, cervical spine, and maxillofacial structures were performed using the standard protocol without intravenous contrast. Multiplanar CT image reconstructions of the cervical spine and maxillofacial structures were also generated. RADIATION DOSE REDUCTION: This exam was performed according to the departmental dose-optimization program which includes automated exposure control, adjustment of the mA and/or kV according to patient size and/or use of iterative reconstruction technique. COMPARISON:  May 02, 2011. FINDINGS: CT HEAD FINDINGS Brain: Large (approximately 4.5 cm) likely extra-axial mass along the left sphenoid wing. There is mass effect on the adjacent anterior temporal lobe with brain edema and approximately 3 mm rightward midline shift. No evidence of acute  hemorrhage or acute large vascular territory infarct. No hydrocephalus. Mild effacement of the left lateral ventricle due to above mass. Vascular: No hyperdense vessel. Skull: No acute fracture. CT MAXILLOFACIAL FINDINGS Osseous: No fracture or mandibular dislocation. No destructive process. Orbits: Negative. No traumatic or inflammatory finding. Sinuses: Inferior maxillary sinus mucosal thickening and probable retention cysts. Soft tissues: Left periorbital contusion. CT CERVICAL SPINE FINDINGS Alignment: Straightening.  No substantial sagittal subluxation. Skull base and vertebrae: No acute fracture. Vertebral body heights are maintained. Soft tissues and spinal canal: No prevertebral fluid or swelling. No visible canal hematoma. Disc levels: Multilevel degenerative disc disease including bridging osteophytes and endplate spurring. Upper chest: Visualized lung apices are clear. IMPRESSION: 1. Large (approximately 4.5 cm) likely extra-axial mass along the left sphenoid wing, possibly a meningioma but incompletely assessed on this noncontrast head CT. There is mass effect on the adjacent anterior temporal lobe with brain edema and approximately 3 mm rightward midline shift. Recommend MRI head with contrast and neurosurgical consultation. 2. No acute traumatic intracranial or cervical spine findings. No facial fracture. Electronically Signed   By: Feliberto HartsFrederick S Jones M.D.   On: 06/15/2022 12:17   CT Maxillofacial Wo Contrast  Result Date: 06/15/2022 CLINICAL DATA:  Facial trauma, blunt; Head trauma, moderate-severe; Polytrauma, blunt EXAM: CT HEAD WITHOUT CONTRAST CT MAXILLOFACIAL WITHOUT CONTRAST CT CERVICAL SPINE WITHOUT CONTRAST TECHNIQUE: Multidetector CT imaging of the head, cervical spine, and maxillofacial structures were performed using the standard protocol without intravenous  contrast. Multiplanar CT image reconstructions of the cervical spine and maxillofacial structures were also generated. RADIATION  DOSE REDUCTION: This exam was performed according to the departmental dose-optimization program which includes automated exposure control, adjustment of the mA and/or kV according to patient size and/or use of iterative reconstruction technique. COMPARISON:  May 02, 2011. FINDINGS: CT HEAD FINDINGS Brain: Large (approximately 4.5 cm) likely extra-axial mass along the left sphenoid wing. There is mass effect on the adjacent anterior temporal lobe with brain edema and approximately 3 mm rightward midline shift. No evidence of acute hemorrhage or acute large vascular territory infarct. No hydrocephalus. Mild effacement of the left lateral ventricle due to above mass. Vascular: No hyperdense vessel. Skull: No acute fracture. CT MAXILLOFACIAL FINDINGS Osseous: No fracture or mandibular dislocation. No destructive process. Orbits: Negative. No traumatic or inflammatory finding. Sinuses: Inferior maxillary sinus mucosal thickening and probable retention cysts. Soft tissues: Left periorbital contusion. CT CERVICAL SPINE FINDINGS Alignment: Straightening.  No substantial sagittal subluxation. Skull base and vertebrae: No acute fracture. Vertebral body heights are maintained. Soft tissues and spinal canal: No prevertebral fluid or swelling. No visible canal hematoma. Disc levels: Multilevel degenerative disc disease including bridging osteophytes and endplate spurring. Upper chest: Visualized lung apices are clear. IMPRESSION: 1. Large (approximately 4.5 cm) likely extra-axial mass along the left sphenoid wing, possibly a meningioma but incompletely assessed on this noncontrast head CT. There is mass effect on the adjacent anterior temporal lobe with brain edema and approximately 3 mm rightward midline shift. Recommend MRI head with contrast and neurosurgical consultation. 2. No acute traumatic intracranial or cervical spine findings. No facial fracture. Electronically Signed   By: Feliberto Harts M.D.   On: 06/15/2022  12:17    Pending Labs Unresulted Labs (From admission, onward)     Start     Ordered   06/15/22 1135  Urinalysis, Routine w reflex microscopic -Urine, Clean Catch  (Trauma Panel)  Once,   URGENT       Question:  Specimen Source  Answer:  Urine, Clean Catch   06/15/22 1135   06/15/22 1135  Lactic acid, plasma  (Trauma Panel)  Once,   STAT        06/15/22 1135   Signed and Held  HIV Antibody (routine testing w rflx)  (HIV Antibody (Routine testing w reflex) panel)  Once,   R        Signed and Held            Vitals/Pain Today's Vitals   06/15/22 1240 06/15/22 1257 06/15/22 1300 06/15/22 1315  BP: (!) 144/71  (!) 141/74 134/77  Pulse: 87 90 88 86  Resp: (!) 22 (!) 23 (!) 24 (!) 24  Temp:      TempSrc:      SpO2: 96% 93% 92% 91%  Weight:      Height:        Isolation Precautions No active isolations  Medications Medications  sodium chloride 0.9 % bolus 1,000 mL (0 mLs Intravenous Stopped 06/15/22 1300)  ondansetron (ZOFRAN) injection 4 mg (4 mg Intravenous Given 06/15/22 1234)  haloperidol lactate (HALDOL) injection 5 mg (5 mg Intramuscular Given 06/15/22 1145)  diphenhydrAMINE (BENADRYL) injection 25 mg (25 mg Intramuscular Given 06/15/22 1146)  midazolam PF (VERSED) injection 5 mg (5 mg Intramuscular Given 06/15/22 1220)  dexamethasone (DECADRON) injection 10 mg (10 mg Intravenous Given 06/15/22 1235)  levETIRAcetam (KEPPRA) IVPB 1500 mg/ 100 mL premix (0 mg Intravenous Stopped 06/15/22 1440)    Mobility  Focused Assessments Neuro Assessment Handoff:  Swallow screen pass? Not assessed Cardiac Rhythm: Normal sinus rhythm       Neuro Assessment:   Neuro Checks:      Has TPA been given? No If patient is a Neuro Trauma and patient is going to OR before floor call report to 4N Charge nurse: [phone] or [phone]   R Recommendations: See Admitting Provider Note  Report given to:   Additional Notes:

## 2022-06-15 NOTE — ED Provider Notes (Signed)
[place] EMERGENCY DEPARTMENT AT [facility] Provider Note  CSN: [id number] Arrival date & time: 06/15/22 1132  Chief Complaint(s) Seizures  HPI Bennett [name] is a 57 y.o. male with past medical history as below, significant for htn who presents to the ED with complaint of ams, agitation. Pt here with agitation/ams, unwitnessed fall per family/ems in bathroom. No witnessed seizure activity by family or EMS, family does report pt had a seizure "many years ago" but does not take AED or f/w neurology. She reports pt was in normal state of health when he woke up this AM, reports he went to bathroom and she heard a loud noise and saw him on the floor. Agitated, called EMS. He was given versed 5mg  IM en route 2/2 agitation. Snoring respirations noted. C-collar in place. No seizure activity noted by EMS en route.   Level 5 caveat, AMS  Past Medical History Past Medical History:  Diagnosis Date   Hypertension    Patient Active Problem List   Diagnosis Date Noted   Meningioma 06/15/2022   Home Medication(s) Prior to Admission medications   Medication Sig Start Date End Date Taking? Authorizing Provider  amLODipine (NORVASC) 10 MG tablet Take by mouth. 09/08/21   [provider]  ibuprofen (ADVIL) 800 MG tablet Take 1 tablet (800 mg total) by mouth every 8 (eight) hours as needed (pain). 12/22/21   Andrew Resides, MD                                                                                                                                    Past Surgical History Past Surgical History:  Procedure Laterality Date   ABDOMINAL SURGERY     Family History History reviewed. No pertinent family history.  Social History Social History   Tobacco Use   Smoking status: Never   Smokeless tobacco: Never  Substance Use Topics   Alcohol use: No   Drug use: No   Allergies Patient has no known allergies.  Review of Systems Review of Systems  Unable to perform  ROS: Mental status change    Physical Exam Vital Signs  I have reviewed the triage vital signs BP 134/77   Pulse 86   Temp (!) 97.5 F (36.4 C) (Axillary)   Resp (!) 24   Ht 7\' 2"  (2.184 m)   Wt 113.4 kg   SpO2 91%   BMI 23.77 kg/m  Physical Exam Vitals and nursing note reviewed. Exam conducted with a chaperone present.  Constitutional:      Appearance: He is obese. He is not toxic-appearing.  HENT:     Head: Normocephalic.     Comments: Abrasion/hematoma left orbit    Right Ear: External ear normal.     Left Ear: External ear normal.     Mouth/Throat:     Mouth: Mucous membranes are moist.  Eyes:     General: No scleral icterus.  Right eye: No discharge.        Left eye: No discharge.     Pupils: Pupils are equal, round, and reactive to light.     Comments: Not compliant with EOM testing  Neck:     Comments: C-collar  Cardiovascular:     Rate and Rhythm: Regular rhythm.  Pulmonary:     Effort: No respiratory distress.     Breath sounds: Normal breath sounds. No wheezing.     Comments: Snoring respirations Abdominal:     General: Abdomen is flat. There is no distension.     Palpations: Abdomen is soft.     Tenderness: There is no guarding.  Musculoskeletal:     Cervical back: No rigidity.     Right lower leg: No edema.     Left lower leg: No edema.  Skin:    General: Skin is warm and dry.     Capillary Refill: Capillary refill takes less than 2 seconds.     Coloration: Skin is not jaundiced.  Neurological:     Mental Status: He is lethargic.     GCS: GCS eye subscore is 4. GCS verbal subscore is 4. GCS motor subscore is 5.     Motor: No seizure activity.     Comments: Not compliant with neuro testing Moving extremities spontaneously, extremities will cross midline No seizure, no clonus     ED Results and Treatments Labs (all labs ordered are listed, but only abnormal results are displayed) Labs Reviewed  COMPREHENSIVE METABOLIC PANEL - Abnormal;  Notable for the following components:      Result Value   Potassium 3.3 (*)    Glucose, Bld 130 (*)    ALT 50 (*)    Total Bilirubin 1.9 (*)    All other components within normal limits  SALICYLATE LEVEL - Abnormal; Notable for the following components:   Salicylate Lvl <7.0 (*)    All other components within normal limits  ACETAMINOPHEN LEVEL - Abnormal; Notable for the following components:   Acetaminophen (Tylenol), Serum <10 (*)    All other components within normal limits  AMMONIA - Abnormal; Notable for the following components:   Ammonia 52 (*)    All other components within normal limits  I-STAT CHEM 8, ED - Abnormal; Notable for the following components:   Potassium 3.2 (*)    Glucose, Bld 130 (*)    All other components within normal limits  I-STAT VENOUS BLOOD GAS, ED - Abnormal; Notable for the following components:   pCO2, Ven 41.1 (*)    pO2, Ven 137 (*)    Potassium 3.2 (*)    All other components within normal limits  TROPONIN I (HIGH SENSITIVITY) - Abnormal; Notable for the following components:   Troponin I (High Sensitivity) 29 (*)    All other components within normal limits  CBC  ETHANOL  PROTIME-INR  CK  BRAIN NATRIURETIC PEPTIDE  URINALYSIS, ROUTINE W REFLEX MICROSCOPIC  LACTIC ACID, PLASMA  SAMPLE TO BLOOD BANK  TROPONIN I (HIGH SENSITIVITY)  Radiology DG Chest Port 1 View  Result Date: 06/15/2022 CLINICAL DATA:  Trauma. EXAM: PORTABLE CHEST 1 VIEW COMPARISON:  January 16, 2014. FINDINGS: Stable cardiomediastinal silhouette. Right lung is clear. Minimal left basilar subsegmental atelectasis. Bony thorax is unremarkable. IMPRESSION: Minimal left basilar subsegmental atelectasis. Electronically Signed   By: Lupita Raider M.D.   On: 06/15/2022 12:20   CT HEAD WO CONTRAST  Result Date: 06/15/2022 CLINICAL DATA:  Facial trauma,  blunt; Head trauma, moderate-severe; Polytrauma, blunt EXAM: CT HEAD WITHOUT CONTRAST CT MAXILLOFACIAL WITHOUT CONTRAST CT CERVICAL SPINE WITHOUT CONTRAST TECHNIQUE: Multidetector CT imaging of the head, cervical spine, and maxillofacial structures were performed using the standard protocol without intravenous contrast. Multiplanar CT image reconstructions of the cervical spine and maxillofacial structures were also generated. RADIATION DOSE REDUCTION: This exam was performed according to the departmental dose-optimization program which includes automated exposure control, adjustment of the mA and/or kV according to patient size and/or use of iterative reconstruction technique. COMPARISON:  May 02, 2011. FINDINGS: CT HEAD FINDINGS Brain: Large (approximately 4.5 cm) likely extra-axial mass along the left sphenoid wing. There is mass effect on the adjacent anterior temporal lobe with brain edema and approximately 3 mm rightward midline shift. No evidence of acute hemorrhage or acute large vascular territory infarct. No hydrocephalus. Mild effacement of the left lateral ventricle due to above mass. Vascular: No hyperdense vessel. Skull: No acute fracture. CT MAXILLOFACIAL FINDINGS Osseous: No fracture or mandibular dislocation. No destructive process. Orbits: Negative. No traumatic or inflammatory finding. Sinuses: Inferior maxillary sinus mucosal thickening and probable retention cysts. Soft tissues: Left periorbital contusion. CT CERVICAL SPINE FINDINGS Alignment: Straightening.  No substantial sagittal subluxation. Skull base and vertebrae: No acute fracture. Vertebral body heights are maintained. Soft tissues and spinal canal: No prevertebral fluid or swelling. No visible canal hematoma. Disc levels: Multilevel degenerative disc disease including bridging osteophytes and endplate spurring. Upper chest: Visualized lung apices are clear. IMPRESSION: 1. Large (approximately 4.5 cm) likely extra-axial mass along  the left sphenoid wing, possibly a meningioma but incompletely assessed on this noncontrast head CT. There is mass effect on the adjacent anterior temporal lobe with brain edema and approximately 3 mm rightward midline shift. Recommend MRI head with contrast and neurosurgical consultation. 2. No acute traumatic intracranial or cervical spine findings. No facial fracture. Electronically Signed   By: Feliberto Harts M.D.   On: 06/15/2022 12:17   CT CERVICAL SPINE WO CONTRAST  Result Date: 06/15/2022 CLINICAL DATA:  Facial trauma, blunt; Head trauma, moderate-severe; Polytrauma, blunt EXAM: CT HEAD WITHOUT CONTRAST CT MAXILLOFACIAL WITHOUT CONTRAST CT CERVICAL SPINE WITHOUT CONTRAST TECHNIQUE: Multidetector CT imaging of the head, cervical spine, and maxillofacial structures were performed using the standard protocol without intravenous contrast. Multiplanar CT image reconstructions of the cervical spine and maxillofacial structures were also generated. RADIATION DOSE REDUCTION: This exam was performed according to the departmental dose-optimization program which includes automated exposure control, adjustment of the mA and/or kV according to patient size and/or use of iterative reconstruction technique. COMPARISON:  May 02, 2011. FINDINGS: CT HEAD FINDINGS Brain: Large (approximately 4.5 cm) likely extra-axial mass along the left sphenoid wing. There is mass effect on the adjacent anterior temporal lobe with brain edema and approximately 3 mm rightward midline shift. No evidence of acute hemorrhage or acute large vascular territory infarct. No hydrocephalus. Mild effacement of the left lateral ventricle due to above mass. Vascular: No hyperdense vessel. Skull: No acute fracture. CT MAXILLOFACIAL FINDINGS Osseous: No fracture or mandibular dislocation. No  destructive process. Orbits: Negative. No traumatic or inflammatory finding. Sinuses: Inferior maxillary sinus mucosal thickening and probable retention  cysts. Soft tissues: Left periorbital contusion. CT CERVICAL SPINE FINDINGS Alignment: Straightening.  No substantial sagittal subluxation. Skull base and vertebrae: No acute fracture. Vertebral body heights are maintained. Soft tissues and spinal canal: No prevertebral fluid or swelling. No visible canal hematoma. Disc levels: Multilevel degenerative disc disease including bridging osteophytes and endplate spurring. Upper chest: Visualized lung apices are clear. IMPRESSION: 1. Large (approximately 4.5 cm) likely extra-axial mass along the left sphenoid wing, possibly a meningioma but incompletely assessed on this noncontrast head CT. There is mass effect on the adjacent anterior temporal lobe with brain edema and approximately 3 mm rightward midline shift. Recommend MRI head with contrast and neurosurgical consultation. 2. No acute traumatic intracranial or cervical spine findings. No facial fracture. Electronically Signed   By: Feliberto Harts M.D.   On: 06/15/2022 12:17   CT Maxillofacial Wo Contrast  Result Date: 06/15/2022 CLINICAL DATA:  Facial trauma, blunt; Head trauma, moderate-severe; Polytrauma, blunt EXAM: CT HEAD WITHOUT CONTRAST CT MAXILLOFACIAL WITHOUT CONTRAST CT CERVICAL SPINE WITHOUT CONTRAST TECHNIQUE: Multidetector CT imaging of the head, cervical spine, and maxillofacial structures were performed using the standard protocol without intravenous contrast. Multiplanar CT image reconstructions of the cervical spine and maxillofacial structures were also generated. RADIATION DOSE REDUCTION: This exam was performed according to the departmental dose-optimization program which includes automated exposure control, adjustment of the mA and/or kV according to patient size and/or use of iterative reconstruction technique. COMPARISON:  May 02, 2011. FINDINGS: CT HEAD FINDINGS Brain: Large (approximately 4.5 cm) likely extra-axial mass along the left sphenoid wing. There is mass effect on the  adjacent anterior temporal lobe with brain edema and approximately 3 mm rightward midline shift. No evidence of acute hemorrhage or acute large vascular territory infarct. No hydrocephalus. Mild effacement of the left lateral ventricle due to above mass. Vascular: No hyperdense vessel. Skull: No acute fracture. CT MAXILLOFACIAL FINDINGS Osseous: No fracture or mandibular dislocation. No destructive process. Orbits: Negative. No traumatic or inflammatory finding. Sinuses: Inferior maxillary sinus mucosal thickening and probable retention cysts. Soft tissues: Left periorbital contusion. CT CERVICAL SPINE FINDINGS Alignment: Straightening.  No substantial sagittal subluxation. Skull base and vertebrae: No acute fracture. Vertebral body heights are maintained. Soft tissues and spinal canal: No prevertebral fluid or swelling. No visible canal hematoma. Disc levels: Multilevel degenerative disc disease including bridging osteophytes and endplate spurring. Upper chest: Visualized lung apices are clear. IMPRESSION: 1. Large (approximately 4.5 cm) likely extra-axial mass along the left sphenoid wing, possibly a meningioma but incompletely assessed on this noncontrast head CT. There is mass effect on the adjacent anterior temporal lobe with brain edema and approximately 3 mm rightward midline shift. Recommend MRI head with contrast and neurosurgical consultation. 2. No acute traumatic intracranial or cervical spine findings. No facial fracture. Electronically Signed   By: Feliberto Harts M.D.   On: 06/15/2022 12:17    Pertinent labs & imaging results that were available during my care of the patient were reviewed by me and considered in my medical decision making (see MDM for details).  Medications Ordered in ED Medications  sodium chloride 0.9 % bolus 1,000 mL (1,000 mLs Intravenous New Bag/Given 06/15/22 1255)  ondansetron (ZOFRAN) injection 4 mg (4 mg Intravenous Given 06/15/22 1234)  haloperidol lactate (HALDOL)  injection 5 mg (5 mg Intramuscular Given 06/15/22 1145)  diphenhydrAMINE (BENADRYL) injection 25 mg (25 mg Intramuscular Given 06/15/22 1146)  midazolam PF (VERSED) injection 5 mg (5 mg Intramuscular Given 06/15/22 1220)  dexamethasone (DECADRON) injection 10 mg (10 mg Intravenous Given 06/15/22 1235)  levETIRAcetam (KEPPRA) IVPB 1500 mg/ 100 mL premix (1,500 mg Intravenous New Bag/Given 06/15/22 1412)                                                                                                                                     Procedures .Critical Care  Performed by: Sloan Leiter, DO Authorized by: Sloan Leiter, DO   Critical care provider statement:    Critical care time (minutes):  32   Critical care time was exclusive of:  Separately billable procedures and treating other patients   Critical care was necessary to treat or prevent imminent or life-threatening deterioration of the following conditions:  Trauma and CNS failure or compromise   Critical care was time spent personally by me on the following activities:  Development of treatment plan with patient or surrogate, discussions with consultants, evaluation of patient's response to treatment, examination of patient, ordering and review of laboratory studies, ordering and review of radiographic studies, ordering and performing treatments and interventions, pulse oximetry, re-evaluation of patient's condition, review of old charts and obtaining history from patient or surrogate   Care discussed with: admitting provider     (including critical care time)  Medical Decision Making / ED Course    Medical Decision Making:    Andrew Lawrence is a 57 y.o. male  with past medical history as below, significant for htn who presents to the ED with complaint of ams, agitation. Pt here with agitation/ams, unwitnessed fall per family/ems in bathroom.. The complaint involves an extensive differential diagnosis and also carries with it a high  risk of complications and morbidity.  Serious etiology was considered. Ddx includes but is not limited to: Differential diagnoses for altered mental status includes but is not exclusive to alcohol, illicit or prescription medications, intracranial pathology such as stroke, intracerebral hemorrhage, fever or infectious causes including sepsis, hypoxemia, uremia, trauma, endocrine related disorders such as diabetes, hypoglycemia, thyroid-related diseases, etc. Differential diagnoses for head trauma includes subdural hematoma, epidural hematoma, acute concussion, traumatic subarachnoid hemorrhage, cerebral contusions, etc.   Complete initial physical exam performed, notably the patient  was agitated, combative, breathing spontaneously, withdraws to noxious stimulus .    Patient with AMS, evidence of head trauma, no thinners.  Activate level 2 trauma.  Reviewed and confirmed nursing documentation for past medical history, family history, social history.  Vital signs reviewed.    Clinical Course as of 06/15/22 1431  Thu Jun 15, 2022  1222 Pt with large sphenoid mass on [facility] w/ edema and 3mm midline shift. Will give decadron, will d/w NSGY, consult paged [SG]  1224 Pt agitated during and following CT was given versed PTA and haldol in the ED. Will repeat versed IM to facilitate care [SG]    Clinical Course  User Index [SG] Sloan Leiter, DO   Agitated, given versed PTA by EMS, will give haldol to help faciliate imaging. Pt continues to be combative/agitated/not compliant, will give rpt versed Discussed with wife at bedside, broken [place] but does not want to use interpreter;  able to obtain further history, reports that patient was in normal state of health prior to incident, went to the bathroom, she heard a loud thud and saw him on the floor of the bathroom, agitated.  Called EMS.  Distant history of seizure, no seizure medications.  Does not follow with neurology.  No chronic alcohol use, no illicit  substance use reported by wife.  Labs reviewed, bilirubin is mildly elevated, similar to prior.  Ammonia is 52.  LFTs are stable, ALT is 50 but AST is normal.  Again bili is 1.9.  Initial troponin is 29, EKG is stable.  Will get delta.  Patient snoring very loudly, will intermittently desat while asleep.  Concern for possible underlying sleep apnea.  Placed on high flow  CT head shows large intracranial mass, midline shift.  CT cervical spine is stable.  Chest x-ray stable.  Discussed with neurosurgery, recommend MRI.  They will admit patient.  Start Decadron and Keppra as possible seizure pta but really unclear at this point.   Admit to NSGY, family updated    Additional history obtained: -Additional history obtained from ems and spouse -External records from outside source obtained and reviewed including: Chart review including previous notes, labs, imaging, consultation notes including prior ed visits, home meds, prior labs/imaging    Lab Tests: -I ordered, reviewed, and interpreted labs.   The pertinent results include:   Labs Reviewed  COMPREHENSIVE METABOLIC PANEL - Abnormal; Notable for the following components:      Result Value   Potassium 3.3 (*)    Glucose, Bld 130 (*)    ALT 50 (*)    Total Bilirubin 1.9 (*)    All other components within normal limits  SALICYLATE LEVEL - Abnormal; Notable for the following components:   Salicylate Lvl <7.0 (*)    All other components within normal limits  ACETAMINOPHEN LEVEL - Abnormal; Notable for the following components:   Acetaminophen (Tylenol), Serum <10 (*)    All other components within normal limits  AMMONIA - Abnormal; Notable for the following components:   Ammonia 52 (*)    All other components within normal limits  I-STAT CHEM 8, ED - Abnormal; Notable for the following components:   Potassium 3.2 (*)    Glucose, Bld 130 (*)    All other components within normal limits  I-STAT VENOUS BLOOD GAS, ED - Abnormal; Notable  for the following components:   pCO2, Ven 41.1 (*)    pO2, Ven 137 (*)    Potassium 3.2 (*)    All other components within normal limits  TROPONIN I (HIGH SENSITIVITY) - Abnormal; Notable for the following components:   Troponin I (High Sensitivity) 29 (*)    All other components within normal limits  CBC  ETHANOL  PROTIME-INR  CK  BRAIN NATRIURETIC PEPTIDE  URINALYSIS, ROUTINE W REFLEX MICROSCOPIC  LACTIC ACID, PLASMA  SAMPLE TO BLOOD BANK  TROPONIN I (HIGH SENSITIVITY)    Notable for as above  EKG   EKG Interpretation  Date/Time:  Thursday June 15 2022 11:34:46 EDT Ventricular Rate:  107 PR Interval:  164 QRS Duration: 106 QT Interval:  352 QTC Calculation: 470 R Axis:   13 Text Interpretation: Sinus tachycardia  Abnormal R-wave progression, early transition LVH with secondary repolarization abnormality no stemi Confirmed by Tanda Rockers ([id number]) on 06/15/2022 2:29:25 PM         Imaging Studies ordered: I ordered imaging studies including CTH CT C/S CXR I independently visualized the following imaging with scope of interpretation limited to determining acute life threatening conditions related to emergency care; findings noted above, significant for large intracranial mass as above I independently visualized and interpreted imaging. I agree with the radiologist interpretation   Medicines ordered and prescription drug management: Meds ordered this encounter  Medications   sodium chloride 0.9 % bolus 1,000 mL   ondansetron (ZOFRAN) injection 4 mg   haloperidol lactate (HALDOL) injection 5 mg   diphenhydrAMINE (BENADRYL) injection 25 mg   midazolam PF (VERSED) injection 5 mg   midazolam PF (VERSED) 10 MG/2ML injection    Laural Benes, [place] M: cabinet override   dexamethasone (DECADRON) injection 10 mg   levETIRAcetam (KEPPRA) IVPB 1500 mg/ 100 mL premix    -I have reviewed the patients home medicines and have made adjustments as needed   Consultations  Obtained: I requested consultation with the Dr Conchita Paris,  and discussed lab and imaging findings as well as pertinent plan - they recommend: admit   Cardiac Monitoring: The patient was maintained on a cardiac monitor.  I personally viewed and interpreted the cardiac monitored which showed an underlying rhythm of: sinus tachy  Social Determinants of Health:  Diagnosis or treatment significantly limited by social determinants of health: uninsured   Reevaluation: After the interventions noted above, I reevaluated the patient and found that they have improved  Co morbidities that complicate the patient evaluation  Past Medical History:  Diagnosis Date   Hypertension       Dispostion: Disposition decision including need for hospitalization was considered, and patient admitted to the hospital.    Final Clinical Impression(s) / ED Diagnoses Final diagnoses:  Altered mental status, unspecified altered mental status type  Brain mass     This chart was dictated using voice recognition software.  Despite best efforts to proofread,  errors can occur which can change the documentation meaning.    Tanda Rockers A, DO 06/15/22 1431

## 2022-06-15 NOTE — ED Notes (Signed)
Trauma Response Nurse Documentation   [name] is a 57 y.o. male arriving to [facility] ED via EMS  On No antithrombotic. Trauma was activated as a Level 2 by ED Charge RN based on the following trauma criteria GCS 10-14 associated with trauma or AVPU < A. Patient arrived and was activated post arrival due to falling and low GCS.  Patient cleared for CT by Dr. Wallace Cullens. Pt transported to CT with trauma response nurse present to monitor. RN remained with the patient throughout their absence from the department for clinical observation.   GCS 12.  History   Past Medical History:  Diagnosis Date   Hypertension      Past Surgical History:  Procedure Laterality Date   ABDOMINAL SURGERY       Initial Focused Assessment (If applicable, or please see trauma documentation): - Agitated and confused on arrival - Disoriented to place, time and situation  - C-collar in place - Abrasion/swelling to L eyebrow   CT's Completed:   CT Head, CT Maxillofacial, and CT C-Spine   Interventions:  - 10mg  versed given IM due to agitation - CTs complete. - CXR - 20G PIV to R hand - labs drawn - Updated wife at bedside - Placed O2 onto patient due to sleep apnea/snoring resp  Plan for disposition:  Other Awaiting neurosurgery consult  Consults completed:  Neurosurgeon at 1222.  Event Summary: Pt had an unwitnessed fall at home causing him to strike his head.  Pt arrived to ED with a low GCS and then was activated as a level 2 trauma.  Pt is not on blood thinners.  Wife is at bedside.   Bedside handoff with ED RN [place].    Andrew Lawrence  Trauma Response RN  Please call TRN at [phone] for further assistance.

## 2022-06-15 NOTE — ED Notes (Signed)
Patient transported to CT 
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
## 2022-06-15 NOTE — ED Notes (Signed)
Pt transported to CT with RN. Upon arrival to CT pt became combative and trying to get out of bed, charge RN notified and MD gray to CT for assistance. Pt O2 sats 98% at this time. See new orders.

## 2022-06-15 NOTE — ED Notes (Signed)
Paged Dr. Conchita Paris spoke with staff assisting, notified that MD wasn't available at the time & staff notified to make Dr. Conchita Paris aware pt was not able to complete MRI. RN that transported t to MRI stated she was not able to get a hold of provider to have prn meds ordered to help pt stay still for scan. Staff stated they will notify MD

## 2022-06-15 NOTE — ED Notes (Signed)
Pt transported to MRI with RN.

## 2022-06-15 NOTE — H&P (Signed)
  Chief Complaint   Chief Complaint  Patient presents with   Seizures    History of Present Illness  Andrew Lawrence is a 57 y.o. male brought in by EMS after he was found down by his wife at home.  History is primarily obtained through the EMR as the patient is unable to provide meaningful history at this time and his wife is a non-English speaker.  Patient was apparently found by his wife on the floor after hearing a thud.  EMS responded and noted the patient to be alert but combative.  There was concern that he had had a seizure.  He was given some Versed at the scene.  Past Medical History   Past Medical History:  Diagnosis Date   Hypertension     Past Surgical History   Past Surgical History:  Procedure Laterality Date   ABDOMINAL SURGERY      Social History   Social History   Tobacco Use   Smoking status: Never   Smokeless tobacco: Never  Substance Use Topics   Alcohol use: No   Drug use: No    Medications   Prior to Admission medications   Medication Sig Start Date End Date Taking? Authorizing Provider  amLODipine (NORVASC) 10 MG tablet Take by mouth. 09/08/21   [provider]  famotidine (PEPCID) 20 MG tablet Take 1 tablet (20 mg total) by mouth 2 (two) times daily. Patient taking differently: Take 20 mg by mouth daily as needed for heartburn.  01/16/14   Garlon Hatchet, PA-C  ibuprofen (ADVIL) 800 MG tablet Take 1 tablet (800 mg total) by mouth every 8 (eight) hours as needed (pain). 12/22/21   Zenia Resides, MD    Allergies  No Known Allergies  Review of Systems  ROS  Neurologic Exam  Awake, alert, oriented Memory and concentration grossly intact Speech fluent, appropriate CN grossly intact Motor exam: Upper Extremities Deltoid Bicep Tricep Grip  Right 5/5 5/5 5/5 5/5  Left 5/5 5/5 5/5 5/5   Lower Extremities IP Quad PF DF EHL  Right 5/5 5/5 5/5 5/5 5/5  Left 5/5 5/5 5/5 5/5 5/5   Sensation grossly intact to LT  Imaging   CT scan of the head without contrast was personally reviewed.  This demonstrates a slightly hyperdense likely extra-axial mass arising from the left sphenoid bone with associated left temporal edema and mild left to right midline shift.  Impression  - 57 y.o. male presenting to the hospital with altered mental status, possibly postictal.  Imaging does reveal an extra-axial mass, possibly meningioma with associated left temporal edema.  Plan  -Would recommend starting Keppra 1000 mg x 1 followed by 500 mg every 12 -Will also start dexamethasone 10 mg x 1 followed by 4 mg every 6 hours -I will order MRI of the brain with and without contrast, stereotactic protocol as the patient will likely require operative resection of this lesion given the presence of the edema and his likely seizure -Will admit to the general neuroscience MedSurg unit  Lisbeth Renshaw, MD [facility]

## 2022-06-15 NOTE — ED Notes (Signed)
Family updated as to patient's status.

## 2022-06-16 ENCOUNTER — Inpatient Hospital Stay (HOSPITAL_COMMUNITY): Payer: Medicaid Other

## 2022-06-16 MED ORDER — GADOBUTROL 1 MMOL/ML IV SOLN
10.0000 mL | Freq: Once | INTRAVENOUS | Status: AC | PRN
  Administered 2022-06-16: 10 mL via INTRAVENOUS

## 2022-06-16 MED ORDER — LEVETIRACETAM 500 MG PO TABS
500.0000 mg | ORAL_TABLET | Freq: Two times a day (BID) | ORAL | Status: DC
  Administered 2022-06-16 – 2022-06-19 (×7): 500 mg via ORAL
  Filled 2022-06-16 (×7): qty 1

## 2022-06-16 MED ORDER — DEXAMETHASONE 4 MG PO TABS
4.0000 mg | ORAL_TABLET | Freq: Two times a day (BID) | ORAL | Status: DC
  Administered 2022-06-16 – 2022-06-19 (×7): 4 mg via ORAL
  Filled 2022-06-16 (×7): qty 1

## 2022-06-16 NOTE — Progress Notes (Signed)
1940 pt off the floor with MRI   2132 pt returned to unit vitals WDL

## 2022-06-16 NOTE — Progress Notes (Signed)
  NEUROSURGERY PROGRESS NOTE   No issues overnight. Pt much more lucid this am.   EXAM:  BP 109/61 (BP Location: Left Arm)   Pulse 64   Temp 97.8 F (36.6 C) (Oral)   Resp 19   Ht 7\' 2"  (2.184 m)   Wt 113.4 kg   SpO2 97%   BMI 23.77 kg/m   Awake, alert, oriented x3 Speech fluent, appropriate  CN grossly intact  5/5 BUE/BLE   IMPRESSION:  57 y.o. male presenting after likely SZ related to extra-axial left sphenoid mass, likely meningioma. Now appears at neurologic baseline  PLAN: - Will re-order MRI brain w/w/o Gad stereo protocol - Cont dexamethasone - Cont Tyron Russell, MD [facility]

## 2022-06-17 NOTE — Progress Notes (Signed)
Patient ID: Andrew Lawrence, male   DOB: Jan 13, 1966, 57 y.o.   MRN: [id number] Patient is awake and alert and denies headache.  He has a nonfocal neurologic exam.  MRI shows probable meningioma.  Await Dr. Val Riles recommendations.

## 2022-06-18 NOTE — Progress Notes (Signed)
NEUROSURGERY PROGRESS NOTE  Doing well, no acute events overnight.   Temp:  [97.7 F (36.5 C)-98.6 F (37 C)] 97.7 F (36.5 C) (04/14 0801) Pulse Rate:  [67-74] 69 (04/14 0801) Resp:  [18-27] 21 (04/14 0801) BP: (126-158)/(73-96) 158/96 (04/14 0801) SpO2:  [93 %-97 %] 95 % (04/14 0801)   Sherryl Manges, NP 06/18/2022 9:30 AM

## 2022-06-19 ENCOUNTER — Other Ambulatory Visit: Payer: Self-pay | Admitting: Neurosurgery

## 2022-06-19 MED ORDER — METHYLPREDNISOLONE 4 MG PO TBPK: ORAL_TABLET | ORAL | 0 refills | Status: DC

## 2022-06-19 MED ORDER — LEVETIRACETAM 500 MG PO TABS: 500.0000 mg | ORAL_TABLET | Freq: Two times a day (BID) | ORAL | 3 refills | Status: DC

## 2022-06-19 NOTE — Discharge Summary (Signed)
  Physician Discharge Summary  Patient ID: Andrew Lawrence MRN: [id number] DOB/AGE: 1965-03-13 57 y.o.  Admit date: 06/15/2022 Discharge date: 06/19/2022  Admission Diagnoses:  Meningioma  Discharge Diagnoses:  Same Principal Problem:   Meningioma   Discharged Condition: Stable  Hospital Course:  Andrew Lawrence is a 57 y.o. male admitted after being found altered. He returned to baseline after suspected SZ. His imaging revealed left extra-axial tumor with associated edema likely meningioma. Pt requested d/c home, will plan on outpatient clinic f/u to further discuss surgery likely in the next week.  Treatments: Observation/medication  Discharge Exam: Blood pressure 127/78, pulse (!) 59, temperature 98.3 F (36.8 C), temperature source Oral, resp. rate 16, height 7\' 2"  (2.184 m), weight 113.4 kg, SpO2 98 %. Awake, alert, oriented Speech fluent, appropriate CN grossly intact 5/5 BUE/BLE Wound c/d/i  Disposition: Discharge disposition: 01-Home or Self Care       Discharge Instructions     Call MD for:  redness, tenderness, or signs of infection (pain, swelling, redness, odor or green/yellow discharge around incision site)   Complete by: As directed    Call MD for:  temperature >100.4   Complete by: As directed    Diet - low sodium heart healthy   Complete by: As directed    Discharge instructions   Complete by: As directed    Walk at home as much as possible, at least 4 times / day   Increase activity slowly   Complete by: As directed    Lifting restrictions   Complete by: As directed    No lifting > 10 lbs   May shower / Bathe   Complete by: As directed    48 hours after surgery   May walk up steps   Complete by: As directed    No wound care   Complete by: As directed    Other Restrictions   Complete by: As directed    No bending/twisting at waist      Allergies as of 06/19/2022   No Known Allergies      Medication List     STOP taking these  medications    ibuprofen 800 MG tablet Commonly known as: ADVIL       TAKE these medications    amLODipine 10 MG tablet Commonly known as: NORVASC Take by mouth.   levETIRAcetam 500 MG tablet Commonly known as: KEPPRA Take 1 tablet (500 mg total) by mouth 2 (two) times daily.   methylPREDNISolone 4 MG Tbpk tablet Commonly known as: MEDROL DOSEPAK Take as directed on package        Follow-up Information     Lisbeth Renshaw, MD Follow up.   Specialty: Neurosurgery Why: Follow-up on Wed 06/21/2022 at 3:30p Contact information: [address] [place] [address] [phone]                 Signed: Jackelyn Hoehn 06/19/2022, 9:48 AM

## 2022-06-21 DIAGNOSIS — D329 Benign neoplasm of meninges, unspecified: Secondary | ICD-10-CM | POA: Diagnosis not present

## 2022-06-23 NOTE — Progress Notes (Signed)
Surgical Instructions   Your procedure is scheduled on Friday, 06/30/22 at 1:00 PM.  Report to [facility] Main Entrance "A" at 11:00 AM, then check in with the Admitting office.  Call this number if you have problems the morning of surgery:  [phone]   If you have any questions prior to your surgery date call [phone]: Open Monday-Friday 8am-4pm If you experience any cold or flu symptoms such as cough, fever, chills, shortness of breath, etc. between now and your scheduled surgery, please notify us at the above number    Remember:  Do not eat or drink after midnight the night before your surgery.    Take these medicines the morning of surgery with A SIP OF WATER: Amlodipine (Norvasc), Levetiracetam (Keppra)  As of today, STOP taking any Aspirin (unless otherwise instructed by your surgeon) Aleve, Naproxen, Ibuprofen, Motrin, Advil, Goody's, BC's, all herbal medications, fish oil and all vitamins.   [facility] is not responsible for any belongings or valuables.     Contacts, glasses, hearing aids, dentures or partials may not be worn into surgery, please bring cases for these belongings   For patients admitted to the hospital, discharge time will be determined by your treatment team.  SURGICAL WAITING ROOM VISITATION Patients having surgery or a procedure may have no more than 2 support people in the waiting area - these visitors may rotate.   Children under the age of 39 must have an adult with them who is not the patient. If the patient needs to stay at the hospital during part of their recovery, the visitor guidelines for inpatient rooms apply. Pre-op nurse will coordinate an appropriate time for 1 support person to accompany patient in pre-op.  This support person may not rotate.   Please refer to [email or web address] for the visitor guidelines for Inpatients (after your surgery is over and you are in a regular room).    Special instructions:    Oral Hygiene is also important to reduce your risk of infection.  Remember - BRUSH YOUR TEETH THE MORNING OF SURGERY WITH YOUR REGULAR TOOTHPASTE  [facility]- Preparing For Surgery  Before surgery, you can play an important role. Because skin is not sterile, your skin needs to be as free of germs as possible. You can reduce the number of germs on your skin by washing with CHG (chlorahexidine gluconate) Soap before surgery.  CHG is an antiseptic cleaner which kills germs and bonds with the skin to continue killing germs even after washing.    Please do not use if you have an allergy to CHG or antibacterial soaps. If your skin becomes reddened/irritated stop using the CHG.  Do not shave (including legs and underarms) for at least 48 hours prior to first CHG shower. It is OK to shave your face.  Please follow these instructions carefully.    Shower the NIGHT BEFORE SURGERY and the MORNING OF SURGERY with CHG Soap.   If you chose to wash your hair, wash your hair first as usual with your normal shampoo. After you shampoo, rinse your hair and body thoroughly to remove the shampoo.  Then [employer] and genitals (private parts) with your normal soap and rinse thoroughly to remove soap.  After that Use CHG Soap as you would any other liquid soap. You can apply CHG directly to the skin and wash gently with a scrungie or a clean washcloth.   Apply the CHG Soap to your body ONLY FROM THE NECK DOWN.  Do not  use on open wounds or open sores. Avoid contact with your eyes, ears, mouth and genitals (private parts). Wash Face and genitals (private parts)  with your normal soap.   Wash thoroughly, paying special attention to the area where your surgery will be performed.  Thoroughly rinse your body with warm water from the neck down.  DO NOT shower/wash with your normal soap after using and rinsing off the CHG Soap.  Pat yourself dry with a CLEAN TOWEL.  Wear CLEAN PAJAMAS to  bed the night before surgery  Place CLEAN SHEETS on your bed the night before your surgery  DO NOT SLEEP WITH PETS.  Day of Surgery: Take a shower with CHG soap. Do not wear jewelry. Do not wear lotions, powders, cologne or deodorant. Do not shave 48 hours prior to surgery. Men may shave face and neck. Do not bring valuables to the hospital. Wear Clean/Comfortable clothing the morning of surgery  Remember to brush your teeth WITH YOUR REGULAR TOOTHPASTE.    Please read over the fact sheets that you were given.

## 2022-06-26 ENCOUNTER — Inpatient Hospital Stay (HOSPITAL_COMMUNITY)
Admission: RE | Admit: 2022-06-26 | Discharge: 2022-06-26 | Disposition: A | Discharge status: Home / Self Care | Payer: Self-pay | Source: Ambulatory Visit

## 2022-06-26 NOTE — Progress Notes (Signed)
Pt a no show for PAT appointment. Pt thought it was on Wednesday. Rescheduling for tomorrow at 9 AM.

## 2022-06-27 ENCOUNTER — Encounter (HOSPITAL_COMMUNITY)
Admission: RE | Admit: 2022-06-27 | Discharge: 2022-06-27 | Disposition: A | Discharge status: Home / Self Care | Payer: Medicaid Other | Source: Ambulatory Visit | Attending: Neurosurgery | Admitting: Neurosurgery

## 2022-06-27 ENCOUNTER — Other Ambulatory Visit: Payer: Self-pay

## 2022-06-27 ENCOUNTER — Encounter (HOSPITAL_COMMUNITY): Payer: Self-pay

## 2022-06-27 VITALS — HR 71 | Temp 98.3°F | Ht 75.0 in | Wt 259.6 lb

## 2022-06-27 DIAGNOSIS — Z01812 Encounter for preprocedural laboratory examination: Secondary | ICD-10-CM | POA: Insufficient documentation

## 2022-06-27 DIAGNOSIS — Z01818 Encounter for other preprocedural examination: Secondary | ICD-10-CM

## 2022-06-27 HISTORY — DX: COVID-19: U07.1

## 2022-06-27 LAB — GLUCOSE, CAPILLARY: Glucose-Capillary: 125 mg/dL — ABNORMAL HIGH (ref 70–99)

## 2022-06-27 NOTE — Progress Notes (Signed)
PCP - Dr. Vedia Pereyra  Chest x-ray - 06/15/22 (1 view) EKG - 06/15/22      Anesthesia review: No   Patient denies shortness of breath, fever, cough and chest pain at PAT appointment   All instructions explained to the patient, with a verbal understanding of the material. Patient agrees to go over the instructions while at home for a better understanding. The opportunity to ask questions was provided.

## 2022-06-28 DIAGNOSIS — Z6834 Body mass index (BMI) 34.0-34.9, adult: Secondary | ICD-10-CM | POA: Diagnosis not present

## 2022-06-28 DIAGNOSIS — D329 Benign neoplasm of meninges, unspecified: Secondary | ICD-10-CM | POA: Diagnosis not present

## 2022-06-30 ENCOUNTER — Inpatient Hospital Stay (HOSPITAL_COMMUNITY)
Admission: RE | Admit: 2022-06-30 | Discharge: 2022-07-05 | DRG: 026 | Disposition: A | Discharge status: Home / Self Care | Payer: Medicaid Other | Attending: Neurosurgery | Admitting: Neurosurgery

## 2022-06-30 ENCOUNTER — Inpatient Hospital Stay (HOSPITAL_COMMUNITY)
Admission: RE | Disposition: A | Discharge status: Home / Self Care | Payer: Self-pay | Source: Home / Self Care | Attending: Neurosurgery

## 2022-06-30 ENCOUNTER — Inpatient Hospital Stay (HOSPITAL_COMMUNITY): Payer: Medicaid Other

## 2022-06-30 ENCOUNTER — Other Ambulatory Visit: Payer: Self-pay

## 2022-06-30 ENCOUNTER — Encounter (HOSPITAL_COMMUNITY): Payer: Self-pay | Admitting: Neurosurgery

## 2022-06-30 ENCOUNTER — Inpatient Hospital Stay (HOSPITAL_COMMUNITY): Payer: Medicaid Other | Admitting: Anesthesiology

## 2022-06-30 DIAGNOSIS — R4701 Aphasia: Secondary | ICD-10-CM | POA: Diagnosis present

## 2022-06-30 DIAGNOSIS — D329 Benign neoplasm of meninges, unspecified: Secondary | ICD-10-CM | POA: Diagnosis not present

## 2022-06-30 DIAGNOSIS — Z9889 Other specified postprocedural states: Secondary | ICD-10-CM | POA: Diagnosis not present

## 2022-06-30 DIAGNOSIS — Z8616 Personal history of COVID-19: Secondary | ICD-10-CM

## 2022-06-30 DIAGNOSIS — E876 Hypokalemia: Secondary | ICD-10-CM | POA: Diagnosis present

## 2022-06-30 DIAGNOSIS — Z87891 Personal history of nicotine dependence: Secondary | ICD-10-CM

## 2022-06-30 DIAGNOSIS — I10 Essential (primary) hypertension: Secondary | ICD-10-CM

## 2022-06-30 DIAGNOSIS — D320 Benign neoplasm of cerebral meninges: Secondary | ICD-10-CM | POA: Diagnosis not present

## 2022-06-30 DIAGNOSIS — R9431 Abnormal electrocardiogram [ECG] [EKG]: Secondary | ICD-10-CM | POA: Diagnosis not present

## 2022-06-30 DIAGNOSIS — I2489 Other forms of acute ischemic heart disease: Secondary | ICD-10-CM | POA: Diagnosis present

## 2022-06-30 DIAGNOSIS — Z79899 Other long term (current) drug therapy: Secondary | ICD-10-CM

## 2022-06-30 DIAGNOSIS — G939 Disorder of brain, unspecified: Secondary | ICD-10-CM | POA: Diagnosis not present

## 2022-06-30 DIAGNOSIS — Z603 Acculturation difficulty: Secondary | ICD-10-CM | POA: Diagnosis present

## 2022-06-30 DIAGNOSIS — R079 Chest pain, unspecified: Secondary | ICD-10-CM | POA: Diagnosis not present

## 2022-06-30 HISTORY — PX: CRANIOTOMY: SHX93

## 2022-06-30 HISTORY — PX: APPLICATION OF CRANIAL NAVIGATION: SHX6578

## 2022-06-30 LAB — POCT I-STAT 7, (LYTES, BLD GAS, ICA,H+H)
Acid-Base Excess: 1 mmol/L (ref 0.0–2.0)
Bicarbonate: 25.7 mmol/L (ref 20.0–28.0)
Calcium, Ion: 1.13 mmol/L — ABNORMAL LOW (ref 1.15–1.40)
HCT: 37 % — ABNORMAL LOW (ref 39.0–52.0)
Hemoglobin: 12.6 g/dL — ABNORMAL LOW (ref 13.0–17.0)
O2 Saturation: 96 %
Patient temperature: 36.2
Potassium: 3.3 mmol/L — ABNORMAL LOW (ref 3.5–5.1)
Sodium: 138 mmol/L (ref 135–145)
TCO2: 27 mmol/L (ref 22–32)
pCO2 arterial: 40.5 mmHg (ref 32–48)
pH, Arterial: 7.408 (ref 7.35–7.45)
pO2, Arterial: 77 mmHg — ABNORMAL LOW (ref 83–108)

## 2022-06-30 LAB — TYPE AND SCREEN
ABO/RH(D): O POS
Unit division: 0

## 2022-06-30 LAB — BPAM RBC
Blood Product Expiration Date: 202405222359
Blood Product Expiration Date: 202405222359

## 2022-06-30 LAB — TROPONIN I (HIGH SENSITIVITY)
Troponin I (High Sensitivity): 22 ng/L — ABNORMAL HIGH (ref <18)
Troponin I (High Sensitivity): 18 ng/L — ABNORMAL HIGH (ref <18)

## 2022-06-30 LAB — MRSA NEXT GEN BY PCR, NASAL: MRSA by PCR Next Gen: NOT DETECTED

## 2022-06-30 LAB — ABO/RH: ABO/RH(D): O POS

## 2022-06-30 LAB — PREPARE RBC (CROSSMATCH)

## 2022-06-30 SURGERY — CRANIOTOMY TUMOR EXCISION
Anesthesia: General | Site: Head | Laterality: Left

## 2022-06-30 MED ORDER — LACTATED RINGERS IV SOLN: INTRAVENOUS | Status: DC

## 2022-06-30 MED ORDER — OXYCODONE HCL 5 MG PO TABS: 5.0000 mg | ORAL_TABLET | Freq: Once | ORAL | Status: DC | PRN

## 2022-06-30 MED ORDER — SODIUM CHLORIDE 0.9 % IV SOLN: INTRAVENOUS | Status: DC | PRN

## 2022-06-30 MED ORDER — ACETAMINOPHEN 325 MG PO TABS
650.0000 mg | ORAL_TABLET | ORAL | Status: DC | PRN
  Administered 2022-07-01 – 2022-07-05 (×15): 650 mg via ORAL
  Filled 2022-06-30 (×15): qty 2

## 2022-06-30 MED ORDER — HYDROCODONE-ACETAMINOPHEN 5-325 MG PO TABS
1.0000 | ORAL_TABLET | ORAL | Status: DC | PRN
  Administered 2022-07-01 – 2022-07-05 (×12): 1 via ORAL
  Filled 2022-06-30 (×12): qty 1

## 2022-06-30 MED ORDER — CEFAZOLIN SODIUM-DEXTROSE 2-4 GM/100ML-% IV SOLN
2.0000 g | Freq: Three times a day (TID) | INTRAVENOUS | Status: AC
  Administered 2022-06-30 – 2022-07-01 (×2): 2 g via INTRAVENOUS
  Filled 2022-06-30 (×2): qty 100

## 2022-06-30 MED ORDER — BUPIVACAINE HCL (PF) 0.5 % IJ SOLN
INTRAMUSCULAR | Status: DC | PRN
  Administered 2022-06-30: 3 mL

## 2022-06-30 MED ORDER — PROPOFOL 10 MG/ML IV BOLUS
INTRAVENOUS | Status: AC
  Filled 2022-06-30: qty 20

## 2022-06-30 MED ORDER — PROPOFOL 10 MG/ML IV BOLUS
INTRAVENOUS | Status: DC | PRN
  Administered 2022-06-30 (×2): 50 mg via INTRAVENOUS
  Administered 2022-06-30: 200 mg via INTRAVENOUS

## 2022-06-30 MED ORDER — THROMBIN 5000 UNITS EX SOLR
CUTANEOUS | Status: AC
  Filled 2022-06-30: qty 5000

## 2022-06-30 MED ORDER — PANTOPRAZOLE SODIUM 40 MG IV SOLR
40.0000 mg | Freq: Every day | INTRAVENOUS | Status: DC
  Administered 2022-06-30 – 2022-07-04 (×5): 40 mg via INTRAVENOUS
  Filled 2022-06-30 (×5): qty 10

## 2022-06-30 MED ORDER — ORAL CARE MOUTH RINSE: 15.0000 mL | Freq: Once | OROMUCOSAL | Status: AC

## 2022-06-30 MED ORDER — CEFAZOLIN SODIUM-DEXTROSE 2-3 GM-%(50ML) IV SOLR
INTRAVENOUS | Status: DC | PRN
  Administered 2022-06-30: 2 g via INTRAVENOUS

## 2022-06-30 MED ORDER — MORPHINE SULFATE (PF) 2 MG/ML IV SOLN
2.0000 mg | INTRAVENOUS | Status: DC | PRN
  Administered 2022-07-03: 2 mg via INTRAVENOUS
  Filled 2022-06-30: qty 1

## 2022-06-30 MED ORDER — OXYCODONE HCL 5 MG/5ML PO SOLN: 5.0000 mg | Freq: Once | ORAL | Status: DC | PRN

## 2022-06-30 MED ORDER — EPHEDRINE 5 MG/ML INJ
INTRAVENOUS | Status: AC
  Filled 2022-06-30: qty 5

## 2022-06-30 MED ORDER — LIDOCAINE-EPINEPHRINE 1 %-1:100000 IJ SOLN
INTRAMUSCULAR | Status: DC | PRN
  Administered 2022-06-30: 3 mL

## 2022-06-30 MED ORDER — SODIUM CHLORIDE 0.9 % IV SOLN: 10.0000 mL/h | Freq: Once | INTRAVENOUS | Status: DC

## 2022-06-30 MED ORDER — ONDANSETRON HCL 4 MG/2ML IJ SOLN
INTRAMUSCULAR | Status: DC | PRN
  Administered 2022-06-30: 4 mg via INTRAVENOUS

## 2022-06-30 MED ORDER — BISACODYL 10 MG RE SUPP: 10.0000 mg | Freq: Every day | RECTAL | Status: DC | PRN

## 2022-06-30 MED ORDER — FENTANYL CITRATE (PF) 100 MCG/2ML IJ SOLN: 25.0000 ug | INTRAMUSCULAR | Status: DC | PRN

## 2022-06-30 MED ORDER — BACITRACIN ZINC 500 UNIT/GM EX OINT
TOPICAL_OINTMENT | CUTANEOUS | Status: DC | PRN
  Administered 2022-06-30 (×2): 1 via TOPICAL

## 2022-06-30 MED ORDER — CLEVIDIPINE BUTYRATE 0.5 MG/ML IV EMUL
0.0000 mg/h | INTRAVENOUS | Status: DC
  Administered 2022-06-30: 2 mg/h via INTRAVENOUS
  Administered 2022-07-01: 8 mg/h via INTRAVENOUS
  Administered 2022-07-01: 21 mg/h via INTRAVENOUS
  Administered 2022-07-01: 10 mg/h via INTRAVENOUS
  Administered 2022-07-01: 14 mg/h via INTRAVENOUS
  Administered 2022-07-01: 10 mg/h via INTRAVENOUS
  Administered 2022-07-01: 21 mg/h via INTRAVENOUS
  Administered 2022-07-01 (×2): 11 mg/h via INTRAVENOUS
  Administered 2022-07-01: 21 mg/h via INTRAVENOUS
  Administered 2022-07-01: 13 mg/h via INTRAVENOUS
  Administered 2022-07-02: 21 mg/h via INTRAVENOUS
  Administered 2022-07-02: 6 mg/h via INTRAVENOUS
  Administered 2022-07-02: 25 mg/h via INTRAVENOUS
  Administered 2022-07-02: 14 mg/h via INTRAVENOUS
  Administered 2022-07-02: 29 mg/h via INTRAVENOUS
  Administered 2022-07-02 (×2): 21 mg/h via INTRAVENOUS
  Administered 2022-07-02: 18 mg/h via INTRAVENOUS
  Administered 2022-07-03: 6 mg/h via INTRAVENOUS
  Filled 2022-06-30: qty 50
  Filled 2022-06-30 (×4): qty 100
  Filled 2022-06-30 (×4): qty 50
  Filled 2022-06-30 (×2): qty 100
  Filled 2022-06-30: qty 50
  Filled 2022-06-30 (×6): qty 100

## 2022-06-30 MED ORDER — ONDANSETRON HCL 4 MG PO TABS: 4.0000 mg | ORAL_TABLET | ORAL | Status: DC | PRN

## 2022-06-30 MED ORDER — ROCURONIUM BROMIDE 10 MG/ML (PF) SYRINGE
PREFILLED_SYRINGE | INTRAVENOUS | Status: DC | PRN
  Administered 2022-06-30: 30 mg via INTRAVENOUS
  Administered 2022-06-30: 70 mg via INTRAVENOUS

## 2022-06-30 MED ORDER — ONDANSETRON HCL 4 MG/2ML IJ SOLN: 4.0000 mg | INTRAMUSCULAR | Status: DC | PRN

## 2022-06-30 MED ORDER — 0.9 % SODIUM CHLORIDE (POUR BTL) OPTIME
TOPICAL | Status: DC | PRN
  Administered 2022-06-30: 3000 mL

## 2022-06-30 MED ORDER — LIDOCAINE 2% (20 MG/ML) 5 ML SYRINGE
INTRAMUSCULAR | Status: DC | PRN
  Administered 2022-06-30: 60 mg via INTRAVENOUS

## 2022-06-30 MED ORDER — MANNITOL 25 % IV SOLN
INTRAVENOUS | Status: DC | PRN
  Administered 2022-06-30: 50 g via INTRAVENOUS

## 2022-06-30 MED ORDER — PHENYLEPHRINE HCL-NACL 20-0.9 MG/250ML-% IV SOLN
INTRAVENOUS | Status: DC | PRN
  Administered 2022-06-30: 20 ug/min via INTRAVENOUS

## 2022-06-30 MED ORDER — LIDOCAINE-EPINEPHRINE 1 %-1:100000 IJ SOLN
INTRAMUSCULAR | Status: AC
  Filled 2022-06-30: qty 1

## 2022-06-30 MED ORDER — FENTANYL CITRATE (PF) 250 MCG/5ML IJ SOLN
INTRAMUSCULAR | Status: DC | PRN
  Administered 2022-06-30: 25 ug via INTRAVENOUS
  Administered 2022-06-30: 150 ug via INTRAVENOUS
  Administered 2022-06-30: 50 ug via INTRAVENOUS
  Administered 2022-06-30: 25 ug via INTRAVENOUS

## 2022-06-30 MED ORDER — THROMBIN 20000 UNITS EX SOLR
CUTANEOUS | Status: AC
  Filled 2022-06-30: qty 20000

## 2022-06-30 MED ORDER — ALBUMIN HUMAN 5 % IV SOLN: INTRAVENOUS | Status: DC | PRN

## 2022-06-30 MED ORDER — CEFAZOLIN SODIUM 1 G IJ SOLR
INTRAMUSCULAR | Status: AC
  Filled 2022-06-30: qty 20

## 2022-06-30 MED ORDER — ONDANSETRON HCL 4 MG/2ML IJ SOLN
INTRAMUSCULAR | Status: AC
  Filled 2022-06-30: qty 2

## 2022-06-30 MED ORDER — CHLORHEXIDINE GLUCONATE CLOTH 2 % EX PADS: 6.0000 | MEDICATED_PAD | Freq: Once | CUTANEOUS | Status: DC

## 2022-06-30 MED ORDER — HEMOSTATIC AGENTS (NO CHARGE) OPTIME
TOPICAL | Status: DC | PRN
  Administered 2022-06-30: 1 via TOPICAL

## 2022-06-30 MED ORDER — LABETALOL HCL 5 MG/ML IV SOLN
10.0000 mg | INTRAVENOUS | Status: DC | PRN
  Administered 2022-06-30 (×2): 20 mg via INTRAVENOUS
  Administered 2022-06-30: 40 mg via INTRAVENOUS
  Administered 2022-06-30: 20 mg via INTRAVENOUS
  Administered 2022-06-30 – 2022-07-01 (×3): 40 mg via INTRAVENOUS
  Administered 2022-07-02 (×4): 20 mg via INTRAVENOUS
  Administered 2022-07-03: 40 mg via INTRAVENOUS
  Filled 2022-06-30: qty 8
  Filled 2022-06-30 (×2): qty 4
  Filled 2022-06-30: qty 8
  Filled 2022-06-30 (×2): qty 4
  Filled 2022-06-30 (×3): qty 8
  Filled 2022-06-30 (×2): qty 4

## 2022-06-30 MED ORDER — CHLORHEXIDINE GLUCONATE CLOTH 2 % EX PADS
6.0000 | MEDICATED_PAD | Freq: Every day | CUTANEOUS | Status: DC
  Administered 2022-06-30 – 2022-07-02 (×3): 6 via TOPICAL

## 2022-06-30 MED ORDER — BACITRACIN ZINC 500 UNIT/GM EX OINT
TOPICAL_OINTMENT | CUTANEOUS | Status: AC
  Filled 2022-06-30: qty 28.35

## 2022-06-30 MED ORDER — SODIUM CHLORIDE 0.9 % IV SOLN: INTRAVENOUS | Status: DC

## 2022-06-30 MED ORDER — LEVETIRACETAM IN NACL 1000 MG/100ML IV SOLN
1000.0000 mg | Freq: Once | INTRAVENOUS | Status: AC
  Administered 2022-06-30: 1000 mg via INTRAVENOUS
  Filled 2022-06-30 (×2): qty 100

## 2022-06-30 MED ORDER — DEXAMETHASONE SODIUM PHOSPHATE 10 MG/ML IJ SOLN
INTRAMUSCULAR | Status: DC | PRN
  Administered 2022-06-30: 10 mg via INTRAVENOUS

## 2022-06-30 MED ORDER — PROMETHAZINE HCL 25 MG PO TABS: 12.5000 mg | ORAL_TABLET | ORAL | Status: DC | PRN

## 2022-06-30 MED ORDER — SENNOSIDES-DOCUSATE SODIUM 8.6-50 MG PO TABS: 1.0000 | ORAL_TABLET | Freq: Every evening | ORAL | Status: DC | PRN

## 2022-06-30 MED ORDER — SODIUM CHLORIDE 0.9 % IR SOLN
Status: DC | PRN
  Administered 2022-06-30: 1000 mL

## 2022-06-30 MED ORDER — ACETAMINOPHEN 650 MG RE SUPP: 650.0000 mg | RECTAL | Status: DC | PRN

## 2022-06-30 MED ORDER — FENTANYL CITRATE (PF) 250 MCG/5ML IJ SOLN
INTRAMUSCULAR | Status: AC
  Filled 2022-06-30: qty 5

## 2022-06-30 MED ORDER — BUPIVACAINE HCL (PF) 0.5 % IJ SOLN
INTRAMUSCULAR | Status: AC
  Filled 2022-06-30: qty 30

## 2022-06-30 MED ORDER — LIDOCAINE 2% (20 MG/ML) 5 ML SYRINGE
INTRAMUSCULAR | Status: AC
  Filled 2022-06-30: qty 5

## 2022-06-30 MED ORDER — DEXAMETHASONE SODIUM PHOSPHATE 10 MG/ML IJ SOLN
INTRAMUSCULAR | Status: AC
  Filled 2022-06-30: qty 1

## 2022-06-30 MED ORDER — LEVETIRACETAM IN NACL 500 MG/100ML IV SOLN
500.0000 mg | Freq: Two times a day (BID) | INTRAVENOUS | Status: DC
  Administered 2022-06-30 – 2022-07-05 (×10): 500 mg via INTRAVENOUS
  Filled 2022-06-30 (×10): qty 100

## 2022-06-30 MED ORDER — SUGAMMADEX SODIUM 200 MG/2ML IV SOLN
INTRAVENOUS | Status: DC | PRN
  Administered 2022-06-30: 200 mg via INTRAVENOUS

## 2022-06-30 MED ORDER — ROCURONIUM BROMIDE 10 MG/ML (PF) SYRINGE
PREFILLED_SYRINGE | INTRAVENOUS | Status: AC
  Filled 2022-06-30: qty 10

## 2022-06-30 MED ORDER — CHLORHEXIDINE GLUCONATE 0.12 % MT SOLN
15.0000 mL | Freq: Once | OROMUCOSAL | Status: AC
  Administered 2022-06-30: 15 mL via OROMUCOSAL
  Filled 2022-06-30: qty 15

## 2022-06-30 MED ORDER — PHENYLEPHRINE 80 MCG/ML (10ML) SYRINGE FOR IV PUSH (FOR BLOOD PRESSURE SUPPORT)
PREFILLED_SYRINGE | INTRAVENOUS | Status: DC | PRN
  Administered 2022-06-30: 160 ug via INTRAVENOUS

## 2022-06-30 MED ORDER — BUPIVACAINE-EPINEPHRINE (PF) 0.25% -1:200000 IJ SOLN
INTRAMUSCULAR | Status: AC
  Filled 2022-06-30: qty 30

## 2022-06-30 MED ORDER — THROMBIN 5000 UNITS EX SOLR: OROMUCOSAL | Status: DC | PRN

## 2022-06-30 MED ORDER — CEFAZOLIN SODIUM-DEXTROSE 2-4 GM/100ML-% IV SOLN
2.0000 g | INTRAVENOUS | Status: AC
  Administered 2022-06-30: 2 g via INTRAVENOUS
  Filled 2022-06-30: qty 100

## 2022-06-30 MED ORDER — THROMBIN 20000 UNITS EX SOLR: CUTANEOUS | Status: DC | PRN

## 2022-06-30 MED ORDER — AMLODIPINE BESYLATE 10 MG PO TABS
10.0000 mg | ORAL_TABLET | Freq: Every day | ORAL | Status: DC
  Administered 2022-07-01 – 2022-07-05 (×5): 10 mg via ORAL
  Filled 2022-06-30 (×5): qty 1

## 2022-06-30 MED ORDER — ONDANSETRON HCL 4 MG/2ML IJ SOLN: 4.0000 mg | Freq: Four times a day (QID) | INTRAMUSCULAR | Status: DC | PRN

## 2022-06-30 SURGICAL SUPPLY — 106 items
APL SKNCLS STERI-STRIP NONHPOA (GAUZE/BANDAGES/DRESSINGS)
BAG COUNTER SPONGE SURGICOUNT (BAG) ×2 IMPLANT
BAG SPNG CNTER NS LX DISP (BAG) ×2
BAND INSRT 18 STRL LF DISP RB (MISCELLANEOUS)
BAND RUBBER #18 3X1/16 STRL (MISCELLANEOUS) IMPLANT
BENZOIN TINCTURE PRP APPL 2/3 (GAUZE/BANDAGES/DRESSINGS) IMPLANT
BLADE CLIPPER SURG (BLADE) ×2 IMPLANT
BLADE SAW GIGLI 16 STRL (MISCELLANEOUS) IMPLANT
BLADE SURG 15 STRL LF DISP TIS (BLADE) IMPLANT
BLADE SURG 15 STRL SS (BLADE)
BLADE ULTRA TIP 2M (BLADE) ×2 IMPLANT
BNDG CMPR 75X41 PLY HI ABS (GAUZE/BANDAGES/DRESSINGS) ×2
BNDG GAUZE DERMACEA FLUFF 4 (GAUZE/BANDAGES/DRESSINGS) IMPLANT
BNDG GZE DERMACEA 4 6PLY (GAUZE/BANDAGES/DRESSINGS) ×4
BNDG STRETCH 4X75 STRL LF (GAUZE/BANDAGES/DRESSINGS) IMPLANT
BUR PRECISION FLUTE 5.0 (BURR) IMPLANT
BUR SPIRAL ROUTER 2.3 (BUR) ×2 IMPLANT
CANISTER SUCT 3000ML PPV (MISCELLANEOUS) ×6 IMPLANT
CASSETTE SUCT IRRIG SONOPET IQ (MISCELLANEOUS) IMPLANT
CATH VENTRIC 35X38 W/TROCAR LG (CATHETERS) IMPLANT
CLIP TI MEDIUM 6 (CLIP) IMPLANT
CNTNR URN SCR LID CUP LEK RST (MISCELLANEOUS) ×2 IMPLANT
CONT SPEC 4OZ STRL OR WHT (MISCELLANEOUS) ×2
COVER MAYO STAND STRL (DRAPES) IMPLANT
COVERAGE SUPPORT O-ARM STEALTH (MISCELLANEOUS) ×2 IMPLANT
DRAIN SUBARACHNOID (WOUND CARE) IMPLANT
DRAPE HALF SHEET 40X57 (DRAPES) ×2 IMPLANT
DRAPE MICROSCOPE SLANT 54X150 (MISCELLANEOUS) IMPLANT
DRAPE NEUROLOGICAL W/INCISE (DRAPES) ×3 IMPLANT
DRAPE STERI IOBAN 125X83 (DRAPES) IMPLANT
DRAPE SURG 17X23 STRL (DRAPES) IMPLANT
DRAPE WARM FLUID 44X44 (DRAPES) ×3 IMPLANT
DRSG ADAPTIC 3X8 NADH LF (GAUZE/BANDAGES/DRESSINGS) IMPLANT
DRSG TELFA 3X8 NADH STRL (GAUZE/BANDAGES/DRESSINGS) IMPLANT
DURAPREP 6ML APPLICATOR 50/CS (WOUND CARE) ×2 IMPLANT
ELECT REM PT RETURN 9FT ADLT (ELECTROSURGICAL) ×2
ELECTRODE REM PT RTRN 9FT ADLT (ELECTROSURGICAL) ×2 IMPLANT
EVACUATOR 1/8 PVC DRAIN (DRAIN) IMPLANT
EVACUATOR SILICONE 100CC (DRAIN) IMPLANT
FEE COVERAGE SUPPORT O-ARM (MISCELLANEOUS) ×2 IMPLANT
FORCEPS BIPOLAR SPETZLER 8 1.0 (NEUROSURGERY SUPPLIES) ×2 IMPLANT
GAUZE 4X4 16PLY ~~LOC~~+RFID DBL (SPONGE) IMPLANT
GAUZE SPONGE 4X4 12PLY STRL (GAUZE/BANDAGES/DRESSINGS) ×2 IMPLANT
GLOVE BIO SURGEON STRL SZ7.5 (GLOVE) IMPLANT
GLOVE BIOGEL PI IND STRL 7.0 (GLOVE) IMPLANT
GLOVE BIOGEL PI IND STRL 7.5 (GLOVE) ×6 IMPLANT
GLOVE BIOGEL PI IND STRL 8 (GLOVE) IMPLANT
GLOVE ECLIPSE 7.0 STRL STRAW (GLOVE) ×4 IMPLANT
GLOVE ECLIPSE 8.0 STRL XLNG CF (GLOVE) IMPLANT
GLOVE EXAM NITRILE XL STR (GLOVE) IMPLANT
GOWN STRL REUS W/ TWL LRG LVL3 (GOWN DISPOSABLE) ×4 IMPLANT
GOWN STRL REUS W/ TWL XL LVL3 (GOWN DISPOSABLE) IMPLANT
GOWN STRL REUS W/TWL 2XL LVL3 (GOWN DISPOSABLE) IMPLANT
GOWN STRL REUS W/TWL LRG LVL3 (GOWN DISPOSABLE) ×4
GOWN STRL REUS W/TWL XL LVL3 (GOWN DISPOSABLE) ×2
HEMOSTAT POWDER KIT SURGIFOAM (HEMOSTASIS) ×2 IMPLANT
HEMOSTAT SURGICEL 2X14 (HEMOSTASIS) ×2 IMPLANT
HOOK DURA 1/2IN (MISCELLANEOUS) ×2 IMPLANT
IV NS 1000ML (IV SOLUTION) ×2
IV NS 1000ML BAXH (IV SOLUTION) ×3 IMPLANT
KIT BASIN OR (CUSTOM PROCEDURE TRAY) ×2 IMPLANT
KIT DRAIN CSF ACCUDRAIN (MISCELLANEOUS) IMPLANT
KIT TURNOVER KIT B (KITS) ×2 IMPLANT
KNIFE ARACHNOID DISP AM-23-SB (BLADE) IMPLANT
KNIFE ARACHNOID DISP AM-24-S (MISCELLANEOUS) ×3 IMPLANT
MARKER SPHERE PSV REFLC NDI (MISCELLANEOUS) ×9 IMPLANT
NDL SPNL 18GX3.5 QUINCKE PK (NEEDLE) IMPLANT
NEEDLE HYPO 22GX1.5 SAFETY (NEEDLE) ×3 IMPLANT
NEEDLE SPNL 18GX3.5 QUINCKE PK (NEEDLE) IMPLANT
NS IRRIG 1000ML POUR BTL (IV SOLUTION) ×9 IMPLANT
PACK BATTERY CMF DISP FOR DVR (ORTHOPEDIC DISPOSABLE SUPPLIES) IMPLANT
PACK CRANIOTOMY CUSTOM (CUSTOM PROCEDURE TRAY) ×3 IMPLANT
PATTIES SURGICAL .25X.25 (GAUZE/BANDAGES/DRESSINGS) IMPLANT
PATTIES SURGICAL .5 X.5 (GAUZE/BANDAGES/DRESSINGS) IMPLANT
PATTIES SURGICAL .5 X3 (DISPOSABLE) IMPLANT
PATTIES SURGICAL 1/4 X 3 (GAUZE/BANDAGES/DRESSINGS) IMPLANT
PATTIES SURGICAL 1X1 (DISPOSABLE) IMPLANT
PIN MAYFIELD SKULL DISP (PIN) ×2 IMPLANT
PLATE CRANIAL 4H UNI NEURO III (Plate) IMPLANT
PLATE UNIV CMF 16 2H (Plate) IMPLANT
SCREW UNIII AXS SD 1.5X4 (Screw) IMPLANT
SOL ELECTROSURG ANTI STICK (MISCELLANEOUS)
SOLUTION ELECTROSURG ANTI STCK (MISCELLANEOUS) ×4 IMPLANT
SPECIMEN JAR SMALL (MISCELLANEOUS) IMPLANT
SPIKE FLUID TRANSFER (MISCELLANEOUS) ×2 IMPLANT
SPONGE NEURO XRAY DETECT 1X3 (DISPOSABLE) IMPLANT
SPONGE SURGIFOAM ABS GEL 100 (HEMOSTASIS) ×3 IMPLANT
STAPLER VISISTAT 35W (STAPLE) ×2 IMPLANT
STOCKINETTE 6  STRL (DRAPES)
STOCKINETTE 6 STRL (DRAPES) IMPLANT
SUT ETHILON 3 0 FSL (SUTURE) IMPLANT
SUT ETHILON 3 0 PS 1 (SUTURE) IMPLANT
SUT NURALON 4 0 TR CR/8 (SUTURE) ×6 IMPLANT
SUT SILK 0 TIES 10X30 (SUTURE) IMPLANT
SUT VIC AB 0 CT1 18XCR BRD8 (SUTURE) ×6 IMPLANT
SUT VIC AB 0 CT1 8-18 (SUTURE) ×8
SUT VIC AB 3-0 SH 8-18 (SUTURE) ×4 IMPLANT
TAPE CLOTH 1X10 TAN NS (GAUZE/BANDAGES/DRESSINGS) ×3 IMPLANT
TIP TISSUE SONOPET IQ STD 12 (TIP) IMPLANT
TOWEL GREEN STERILE (TOWEL DISPOSABLE) ×3 IMPLANT
TOWEL GREEN STERILE FF (TOWEL DISPOSABLE) ×2 IMPLANT
TRAY FOLEY MTR SLVR 16FR STAT (SET/KITS/TRAYS/PACK) ×3 IMPLANT
TUBE CONNECTING 12X1/4 (SUCTIONS) ×2 IMPLANT
TUBING FEATHERFLOW (TUBING) IMPLANT
UNDERPAD 30X36 HEAVY ABSORB (UNDERPADS AND DIAPERS) ×2 IMPLANT
WATER STERILE IRR 1000ML POUR (IV SOLUTION) ×3 IMPLANT

## 2022-06-30 NOTE — Anesthesia Preprocedure Evaluation (Signed)
Anesthesia Evaluation  Patient identified by MRN, date of birth, ID band Patient awake    Reviewed: Allergy & Precautions, H&P , NPO status , Patient's Chart, lab work & pertinent test results  Airway Mallampati: II   Neck ROM: full    Dental   Pulmonary former smoker   breath sounds clear to auscultation       Cardiovascular hypertension,  Rhythm:regular Rate:Normal     Neuro/Psych meningioma    GI/Hepatic   Endo/Other    Renal/GU      Musculoskeletal   Abdominal   Peds  Hematology   Anesthesia Other Findings   Reproductive/Obstetrics                             Anesthesia Physical Anesthesia Plan  ASA: 2  Anesthesia Plan: General   Post-op Pain Management:    Induction: Intravenous  PONV Risk Score and Plan: 2 and Ondansetron, Dexamethasone, Midazolam and Treatment may vary due to age or medical condition  Airway Management Planned: Oral ETT  Additional Equipment: Arterial line  Intra-op Plan:   Post-operative Plan: Extubation in OR  Informed Consent: I have reviewed the patients History and Physical, chart, labs and discussed the procedure including the risks, benefits and alternatives for the proposed anesthesia with the patient or authorized representative who has indicated his/her understanding and acceptance.     Dental advisory given  Plan Discussed with: CRNA, Anesthesiologist and Surgeon  Anesthesia Plan Comments:        Anesthesia Quick Evaluation

## 2022-06-30 NOTE — Transfer of Care (Signed)
Immediate Anesthesia Transfer of Care Note  Patient: Andrew Lawrence  Procedure(s) Performed: Left Craniotomy for Pterional Menigioma (Left: Head) APPLICATION OF CRANIAL NAVIGATION (Left)  Patient Location: PACU  Anesthesia Type:General  Level of Consciousness: patient cooperative and responds to stimulation  Airway & Oxygen Therapy: Patient Spontanous Breathing and Patient connected to face mask oxygen, OPA  Post-op Assessment: Report given to RN and Post -op Vital signs reviewed and stable  Post vital signs: Reviewed and stable, Moves B)UE with stimulation  Last Vitals:  Vitals Value Taken Time  BP 120/81 06/30/22 1917  Temp    Pulse 76 06/30/22 1919  Resp 22 06/30/22 1919  SpO2 100 % 06/30/22 1919  Vitals shown include unvalidated device data.  Last Pain:  Vitals:   06/30/22 1124  TempSrc:   PainSc: 0-No pain         Complications: No notable events documented.

## 2022-06-30 NOTE — Progress Notes (Signed)
Updated Dr. Jake Samples about patient's blood pressure. Arterial line 157/64 MAP of 85, cuff 127/69 MAP of 84. Dr. Jake Samples aware that multiple doses of PRN labetalol have been given. Verbal orders to initiate cleviprex drip.

## 2022-06-30 NOTE — Progress Notes (Signed)
While in PACU, patient somnolent, responded to commands x 1.  Bilateratl pupils reactive and VSS (see Epic flowsheet)  No PRNs given in PACU.  Dr. Desmond Lope to bedside and was able to get patient to respond to painful stimuli.  Okay to go to unit per Dr. Desmond Lope.
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
## 2022-06-30 NOTE — Anesthesia Procedure Notes (Signed)
Central Venous Catheter Insertion Performed by: Achille Rich, MD, anesthesiologist Start/End4/26/2024 2:20 PM, 06/30/2022 2:30 PM Patient location: Pre-op. Preanesthetic checklist: patient identified, IV checked, site marked, risks and benefits discussed, surgical consent, monitors and equipment checked, pre-op evaluation, timeout performed and anesthesia consent Position: Trendelenburg Patient sedated Hand hygiene performed , maximum sterile barriers used  and Seldinger technique used Catheter size: 7 Fr Central line was placed.Double lumen Procedure performed using ultrasound guided technique. Ultrasound Notes:anatomy identified, needle tip was noted to be adjacent to the nerve/plexus identified, no ultrasound evidence of intravascular and/or intraneural injection and image(s) printed for medical record Attempts: 1 Following insertion, line sutured, dressing applied and Biopatch. Post procedure assessment: blood return through all ports, free fluid flow and no air  Patient tolerated the procedure well with no immediate complications.

## 2022-06-30 NOTE — Progress Notes (Signed)
eLink Physician-Brief Progress Note Patient Name: Andrew Lawrence DOB: 1965/03/28 MRN: [id number]   Date of Service  06/30/2022  HPI/Events of Note  57 yr old male presented following seizure and found to have meningioma for which he underwent resection.  He is not on vent and on room air oxygen.    eICU Interventions  Chart reviewed     Intervention Category Evaluation Type: New Patient Evaluation  Henry Russel, P 06/30/2022, 9:59 PM

## 2022-06-30 NOTE — Op Note (Signed)
NEUROSURGERY OPERATIVE NOTE   PREOP DIAGNOSIS:  Left sphenoid wing meningioma   POSTOP DIAGNOSIS: Same  PROCEDURE: Stereotactic left frontotemporal craniotomy for resection of meningioma  SURGEON: Dr. Lisbeth Renshaw, MD  ASSISTANT: Dr. Monia Pouch, MD  ANESTHESIA: General Endotracheal  EBL: 400cc  SPECIMENS: Left extra-axial tumor for permanent pathology  DRAINS: None  COMPLICATIONS: None immediate  CONDITION: Hemodynamically stable to PACU  HISTORY: Andrew Lawrence is a 57 y.o. male who was initially seen after presenting to the emergency department with onset of seizure.  Patient returned to neurologic baseline and was started on Keppra.  His workup included CT scan and MRI which revealed a large left sphenoid wing extra-axial tumor consistent with meningioma with associated left temporal edema and mass effect.  He was discharged home with urgent outpatient follow-up where we discussed need for surgical resection.  The risks, benefits, and alternatives to surgery as well as the expected postoperative course and recovery were all reviewed in detail with the patient.  After all his questions were answered informed consent was obtained and witnessed.  PROCEDURE IN DETAIL: The patient was brought to the operating room. After induction of general anesthesia, the patient was positioned on the operative table in the [place] head holder in the supine position. All pressure points were meticulously padded.  Preoperative stereotactic MRI scan was then Co. registered with surface markers and an excellent accuracy was achieved.  A standard left frontotemporal "reverse question mark " skin incision was then marked out and prepped and draped in the usual sterile fashion.  After timeout was conducted, the incision was infiltrated with local anesthetic with epinephrine.  Incision was then made sharply and carried through the subcutaneous tissue and the galea.  Raney clips were applied for  hemostasis.  The superficial temporal fascia and superficial fat pad were incised and the deep fascia was identified.  The temporal fat pad was then dissected and reflected anteriorly with the skin.  The temporalis muscle and deep fascia were then incised along the superior temporal line.  The temporalis muscle was then dissected in the subperiosteal plane and reflected inferiorly.  A standard frontotemporal craniotomy flap was then fashioned.  Hemostasis on the epidural plane and the bone edges was easily secured.  High-speed drill was then used to drill down the lesser wing of the sphenoid.  This point the dura was opened and reflected anteriorly.  The microscope was then draped sterilely and brought into the field and the remainder of the tumor resection was done under the microscope using microdissection technique.  At this point Dr. Jake Samples provided invaluable assistance including suctioning and retraction when needed in order to provide visualization during the resection.  Initially, a subfrontal approach was employed in order to identify the optic nerve.  Arachnoid overlying the nerve was then opened sharply and good egress of CSF was encountered.  This did allow some brain relaxation.  I then carefully dissected in the arachnoid plane laterally where there was tumor.  I was able to initially coagulate attachment of this tumor to the anterior skull base.  I was then able to identify the most proximal portion of the supraclinoid carotid.  I then traced it for a short distance more distally.  I was then able to dissect more laterally along the supraclinoid carotid, and I identified the oculomotor nerve and the edge of the tentorium.  At this point, I turned my attention to the more distal sylvian fissure.  The fissure was dissected, with tumor on  the temporal side, and I was able to identify the distal portion of the M1.  Tumor was then carefully dissected away from the inferior and lateral portion of the  distal supraclinoid carotid and the proximal M1.  In this fashion, the tumor was dissected away from the carotid and the MCA working more proximally towards the already dissected supraclinoid carotid.  Subsequent to this, we then dissected tumor away from the more distal M1 and identified the MCA bifurcation.  I then carefully dissected the tumor away from small branches emanating from the middle cerebral artery bifurcation.  Once the tumor was dissected away from the vascular structures, I began to bipolar the tumor capsule initially along the posterior temporal margin.  This allowed slow shrinkage of the capsule and further dissection.  I was then able to identify the inferior margin of the tumor.  At this point, I began to coagulate the attachment of the tumor along the sphenoid wing.  Once this was done, I was then able to remove large blocks of the tumor with Metzenbaum scissors.  This allowed further dissection of the more posterior/deep margin of the tumor.  I was then able to roll the tumor out.  The final attachment to the pia of the uncus was coagulated and divided.  Tumor was sent for permanent pathology.  At this point the wound was irrigated.  No active bleeding was identified.  The dura was reapproximated with interrupted 4-0 Nurolon stitches.  A layer of Gelfoam was placed over the dural surface.  The bone flap was then replaced and plated with standard titanium plates and screws.  Temporalis muscle and fascia were then reapproximated with interrupted 0 Vicryl stitches.  Donnetta Hutching was reapproximated with interrupted 0 Vicryl stitches and the skin was closed with staples.  Patient was removed from the [place] head holder.  Bacitracin ointment and sterile dressings with head wrap were then applied.  At the end of the case all sponge, needle, instrument, and cottonoid counts were correct.  Patient was then extubated and taken to the postanesthesia care unit in stable hemodynamic  condition.    Lisbeth Renshaw, MD [facility]

## 2022-06-30 NOTE — Anesthesia Postprocedure Evaluation (Signed)
Anesthesia Post Note  Patient: CARROL BONDAR  Procedure(s) Performed: Left Craniotomy for Pterional Menigioma (Left: Head) APPLICATION OF CRANIAL NAVIGATION (Left)     Patient location during evaluation: PACU Anesthesia Type: General Level of consciousness: lethargic (responds to painful stimuli) Pain management: pain level controlled Vital Signs Assessment: post-procedure vital signs reviewed and stable Respiratory status: spontaneous breathing, nonlabored ventilation, respiratory function stable and patient connected to nasal cannula oxygen Cardiovascular status: blood pressure returned to baseline and stable Postop Assessment: no apparent nausea or vomiting Anesthetic complications: no Comments: T-wave inversion on EKG during induction of anesthesia that persisted through case. 12 lead EKG in PACU confirmed T-wave inversion. VSS. Cardiology consulted who will see patient on the floor.   No notable events documented.  Last Vitals:  Vitals:   06/30/22 2020 06/30/22 2046  BP:  (!) 168/76  Pulse:  70  Resp:  (!) 29  Temp: 36.4 C 36.6 C  SpO2:  96%    Last Pain:  Vitals:   06/30/22 2046  TempSrc: Oral  PainSc:                  Collene Schlichter

## 2022-06-30 NOTE — Anesthesia Procedure Notes (Signed)
Arterial Line Insertion Start/End4/26/2024 12:45 PM, 06/30/2022 12:50 PM Performed by: Lonia Mad, CRNA, CRNA  Patient location: Pre-op. Preanesthetic checklist: patient identified, IV checked, site marked, risks and benefits discussed, surgical consent, monitors and equipment checked, pre-op evaluation, timeout performed and anesthesia consent Lidocaine 1% used for infiltration Right, radial was placed Catheter size: 20 G Hand hygiene performed  and maximum sterile barriers used  Allen's test indicative of satisfactory collateral circulation Attempts: 1 Procedure performed without using ultrasound guided technique. Following insertion, dressing applied and Biopatch. Post procedure assessment: normal  Patient tolerated the procedure well with no immediate complications.

## 2022-06-30 NOTE — Anesthesia Procedure Notes (Signed)
Procedure Name: Intubation Date/Time: 06/30/2022 2:06 PM  Performed by: Lonia Mad, CRNAPre-anesthesia Checklist: Patient identified, Emergency Drugs available, Suction available and Patient being monitored Patient Re-evaluated:Patient Re-evaluated prior to induction Oxygen Delivery Method: Circle System Utilized Preoxygenation: Pre-oxygenation with 100% oxygen Induction Type: IV induction Ventilation: Mask ventilation without difficulty Laryngoscope Size: Mac and 4 Grade View: Grade I Tube type: Oral Tube size: 7.5 mm Number of attempts: 1 Airway Equipment and Method: Stylet and Oral airway Placement Confirmation: ETT inserted through vocal cords under direct vision, positive ETCO2 and breath sounds checked- equal and bilateral Secured at: 21 cm Tube secured with: Tape Dental Injury: Teeth and Oropharynx as per pre-operative assessment

## 2022-06-30 NOTE — Progress Notes (Signed)
Patient arousable to voice, MAE x 4 to command.  Will continue to monitor.

## 2022-07-01 ENCOUNTER — Inpatient Hospital Stay (HOSPITAL_COMMUNITY): Payer: Medicaid Other

## 2022-07-01 DIAGNOSIS — Z9889 Other specified postprocedural states: Secondary | ICD-10-CM

## 2022-07-01 DIAGNOSIS — R9431 Abnormal electrocardiogram [ECG] [EKG]: Secondary | ICD-10-CM

## 2022-07-01 MED ORDER — GADOBUTROL 1 MMOL/ML IV SOLN
10.0000 mL | Freq: Once | INTRAVENOUS | Status: AC | PRN
  Administered 2022-07-01: 10 mL via INTRAVENOUS

## 2022-07-01 MED ORDER — ORAL CARE MOUTH RINSE
15.0000 mL | OROMUCOSAL | Status: DC
  Administered 2022-07-01 – 2022-07-05 (×17): 15 mL via OROMUCOSAL

## 2022-07-01 MED ORDER — ORAL CARE MOUTH RINSE
15.0000 mL | OROMUCOSAL | Status: DC | PRN
  Administered 2022-07-01: 15 mL via OROMUCOSAL

## 2022-07-01 NOTE — Progress Notes (Signed)
   Consult earlier this AM.  Will expedite echo.  EKG no change on repeat. Enzymes non diagnostic of NSTEMI.  Do not suspect acute coronary syndrome.
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
## 2022-07-01 NOTE — Progress Notes (Signed)
   Providing Compassionate, Quality Care - Together  NEUROSURGERY PROGRESS NOTE   S: No issues overnight.   O: EXAM:  BP 123/60   Pulse 72   Temp 98.3 F (36.8 C) (Oral)   Resp (!) 23   Ht 6\' 3"  (1.905 m)   Wt 116.4 kg   SpO2 94%   BMI 32.07 kg/m   Awake, alert, oriented x3 PERRL Follows commands appropriately in all 4 Speech fluent, appropriate  CNs grossly intact  5/5 BUE/BLE  Dressing clean dry and intact  ASSESSMENT:  57 y.o. male with   Left sphenoid wing meningioma  -status post craniotomy for resection  PLAN: -Overall doing well -MRI later today -Wean Cleviprex as tolerated -PT/OT once off IV drips    Thank you for allowing me to participate in this patient's care.  Please do not hesitate to call with questions or concerns.   Monia Pouch, DO Neurosurgeon [facility] Cell: [phone]

## 2022-07-01 NOTE — Progress Notes (Addendum)
Spoke with nurse, patients blood pressure needed to be controlled before mri, we decided on waiting until the blood pressure medication was finished. Nurse stated it should be finished by shift change, also we had a couple stats from the ED to get to first

## 2022-07-02 ENCOUNTER — Inpatient Hospital Stay (HOSPITAL_COMMUNITY): Payer: Medicaid Other

## 2022-07-02 DIAGNOSIS — R079 Chest pain, unspecified: Secondary | ICD-10-CM

## 2022-07-02 LAB — ECHOCARDIOGRAM COMPLETE
AR max vel: 3.06 cm2
AV Area VTI: 3.37 cm2
AV Area mean vel: 3.2 cm2
AV Mean grad: 7 mmHg
AV Peak grad: 15.5 mmHg
Ao pk vel: 1.97 m/s
Area-P 1/2: 4.63 cm2
Height: 75 in
S' Lateral: 3 cm
Single Plane A4C EF: 69.6 %
Weight: 4105.85 oz

## 2022-07-02 LAB — TRIGLYCERIDES: Triglycerides: 141 mg/dL (ref <150)

## 2022-07-02 NOTE — Progress Notes (Signed)
Paged neurosurgery provider on call [facility]. Provider made aware that arterial line is continuing to read SBP over goal of 150. Provider made aware of cuff BP of 105/67 MAP of 77, while arterial line pressure at time of call was 151/58 MAP of 79. Provider made aware that max dose of labetalol of 300 mg has been reached (per administration instructions). Provider aware that cleviprex is at max dose. Provider made aware that arterial line has shown a slight whip when square wave test has been performed. RN did inquire as to whether provider wished to go strictly by the cuff blood pressure rather than arterial line. RN to continue to go by arterial line. New orders to increase max dose of cleviprex to 32 mg/hour.

## 2022-07-02 NOTE — Progress Notes (Signed)
Neurosurgery provider on call [facility] paged. Patient not oriented to time and seems confused by what is being asked. Patient alert. Patient following commands and moving all extremities. Verbal orders for head CT.

## 2022-07-02 NOTE — Progress Notes (Signed)
   Providing Compassionate, Quality Care - Together  NEUROSURGERY PROGRESS NOTE   S: Some confusion overnight  O: EXAM:  BP 117/68   Pulse 73   Temp (!) 97.4 F (36.3 C) (Oral)   Resp 19   Ht 6\' 3"  (1.905 m)   Wt 116.4 kg   SpO2 90%   BMI 32.07 kg/m   Awake, alert, oriented to person, time PERRL Follows commands appropriately in all 4 Speech fluent, appropriate  CNs grossly intact  5/5 BUE/BLE  Dressing clean dry and intact   ASSESSMENT:  57 y.o. male with    Left sphenoid wing meningioma   -status post craniotomy for resection   PLAN: -DC A-line -MRI and CT show no residual tumor, expected postoperative changes -PT/OT once off IV drips    Thank you for allowing me to participate in this patient's care.  Please do not hesitate to call with questions or concerns.   Monia Pouch, DO Neurosurgeon [facility] Cell: [phone]

## 2022-07-02 NOTE — Progress Notes (Signed)
Progress Note  Patient Name: Andrew Lawrence Date of Encounter: 07/02/2022  Primary Cardiologist:   None   Subjective   Patient is asleep.  His wife says that he does not have any history of heart problems or problems with SOB or pain.    Inpatient Medications    Scheduled Meds:  amLODipine  10 mg Oral Daily   Chlorhexidine Gluconate Cloth  6 each Topical Q0600   mouth rinse  15 mL Mouth Rinse 4 times per day   pantoprazole (PROTONIX) IV  40 mg Intravenous QHS   Continuous Infusions:  sodium chloride 75 mL/hr at 07/02/22 0900   clevidipine 25 mg/hr (07/02/22 0922)   levETIRAcetam 500 mg (07/02/22 0924)   PRN Meds: acetaminophen **OR** acetaminophen, bisacodyl, HYDROcodone-acetaminophen, labetalol, morphine injection, ondansetron **OR** ondansetron (ZOFRAN) IV, mouth rinse, promethazine, senna-docusate   Vital Signs    Vitals:   07/02/22 0659 07/02/22 0700 07/02/22 0800 07/02/22 0900  BP:  117/68 115/67 117/67  Pulse: 71 73 74 73  Resp: (!) 25 19 (!) 22 (!) 32  Temp:   97.8 F (36.6 C)   TempSrc:   Oral   SpO2: 90% 90% 93% 92%  Weight:      Height:        Intake/Output Summary (Last 24 hours) at 07/02/2022 0943 Last data filed at 07/02/2022 0900 Gross per 24 hour  Intake 2632.82 ml  Output 700 ml  Net 1932.82 ml   Filed Weights   06/30/22 1051 06/30/22 2046  Weight: 117.5 kg 116.4 kg    Telemetry    NSR - Personally Reviewed  ECG    NA - Personally Reviewed  Physical Exam   GEN: No acute distress.   Neck: No  JVD Cardiac: RRR, apical systolic murmur radiating slightly out the outflow tract, no diastolic murmurs, rubs, or gallops.  Respiratory: Clear  to auscultation bilaterally. GI: Soft, nontender, non-distended  MS: No  edema; No deformity. Neuro:  Nonfocal  Psych: Normal affect   Labs    Chemistry Recent Labs  Lab 06/30/22 1756  NA 138  K 3.3*     Hematology Recent Labs  Lab 06/30/22 1756  HGB 12.6*  HCT 37.0*     Cardiac EnzymesNo results for input(s): "TROPONINI" in the last 168 hours. No results for input(s): "TROPIPOC" in the last 168 hours.   BNPNo results for input(s): "BNP", "PROBNP" in the last 168 hours.   DDimer No results for input(s): "DDIMER" in the last 168 hours.   Radiology    CT HEAD WO CONTRAST ( )  Result Date: 07/02/2022 CLINICAL DATA:  Mental status change with unknown cause EXAM: CT HEAD WITHOUT CONTRAST TECHNIQUE: Contiguous axial images were obtained from the base of the skull through the vertex without intravenous contrast. RADIATION DOSE REDUCTION: This exam was performed according to the departmental dose-optimization program which includes automated exposure control, adjustment of the mA and/or kV according to patient size and/or use of iterative reconstruction technique. COMPARISON:  Brain MRI from yesterday FINDINGS: Brain: Edematous anterior left temporal lobe correlating with restricted diffusion on prior. Expected blood products and gas related to recent surgery. No hydrocephalus. Midline shift measures 4 mm. Vascular: No hyperdense vessel or unexpected calcification. Skull: Unremarkable left pterional craniotomy. Sinuses/Orbits: Negative IMPRESSION: Stable compared to postoperative brain MRI yesterday. 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 Electronically Signed   By: Tiburcio Pea M.D.   On: 07/02/2022 06:57   MR BRAIN W WO CONTRAST  Result Date: 07/01/2022 CLINICAL DATA:  Extra-axial mass resection, assess treatment response EXAM: MRI HEAD WITHOUT AND WITH CONTRAST TECHNIQUE: Multiplanar, multiecho pulse sequences of the brain and surrounding structures were obtained without and with intravenous contrast. CONTRAST:  10mL GADAVIST GADOBUTROL 1 MMOL/ML IV SOLN COMPARISON:  06/16/2022 FINDINGS: Brain: Status post left frontotemporal craniotomy and resection of a previously noted mass along the medial aspect of the left temporal lobe. The  majority of the mass is no longer seen, with the exception of a portion along the medial left temporal lobe that appears to encase left cavernous ICA and approaches the left orbital apex (series 20, image 20). Restricted diffusion with ADC correlate in anterior left temporal lobe (series 5, images 62-74) and in the anterior inferior left frontal lobe (series 5, images 71-73). Hemosiderin deposition is noted in the resection cavity and in some of the areas that demonstrate restricted diffusion, suggesting that the diffusion restriction may be related to susceptibility. Decreased mass effect on the left temporal lobe, with stable associated edema. Trace subdural hemorrhage subjacent to the craniotomy flap, not unexpected post surgically. 7 mm of left-to-right midline shift, previously 6 mm. No distal restricted diffusion to suggest additional acute infarct. Vascular: Normal arterial flow voids. Normal arterial and venous enhancement. Skull and upper cervical spine: Left frontotemporal craniotomy. Small fluid collection overlying the craniotomy flap measures up to 10 mm in thickness, not unexpected post surgically Sinuses/Orbits: Mucous retention cysts in the left maxillary sinus. No acute finding in the orbits. Other: Superficial skin staples. IMPRESSION: 1. Status post left frontotemporal craniotomy and resection of a previously noted mass along the medial aspect of the left temporal lobe. The majority of the mass is no longer seen, with the exception of a portion medial to the left temporal lobe that appears to encase left cavernous ICA and approaches the left orbital apex. 2. Restricted diffusion with ADC correlate in anterior left temporal lobe and in the anterior inferior left frontal lobe, in areas that were adjacent to the mass, which could represent acute infarcts or be related to susceptibility from blood products in this area. Attention on follow-up. 3. Decreased mass effect on the left temporal lobe, with  stable associated edema. 7 mm of left-to-right midline shift, previously 6 mm. Electronically Signed   By: Wiliam Ke M.D.   On: 07/01/2022 17:48   DG CHEST PORT 1 VIEW  Result Date: 06/30/2022 CLINICAL DATA:  Placement of central venous catheter EXAM: PORTABLE CHEST 1 VIEW COMPARISON:  Previous studies including the examination of 06/15/2022 FINDINGS: There is interval placement of right IJ central venous catheter with its tip in superior vena cava. Transverse diameter of heart is increased. There are no signs of pulmonary edema. There is poor inspiration. Small linear densities are seen in left lower lung field. Left lateral CP angle is indistinct. There is no pneumothorax. IMPRESSION: Small linear densities in left lower lung fields suggest subsegmental atelectasis. Possible minimal left pleural effusion. Cardiomegaly. There are no signs of pulmonary edema or focal pulmonary consolidation. Electronically Signed   By: Ernie Avena M.D.   On: 06/30/2022 19:51    Cardiac Studies   ECHO:   1. Left ventricular ejection fraction, by estimation, is 60 to 65%. The  left ventricle has normal function. The left ventricle has no regional  wall motion abnormalities. There is moderate left ventricular hypertrophy.  Left ventricular diastolic  parameters were normal.   2. Right ventricular systolic function is normal. The right ventricular  size is  normal.   3. The mitral valve is abnormal. Trivial mitral valve regurgitation. No  evidence of mitral stenosis.   4. The aortic valve is tricuspid. There is mild calcification of the  aortic valve. Aortic valve regurgitation is not visualized. Aortic valve  sclerosis is present, with no evidence of aortic valve stenosis.   5. Aortic dilatation noted. There is mild dilatation of the ascending  aorta, measuring 38 mm.   6. The inferior vena cava is normal in size with greater than 50%  respiratory variability, suggesting right atrial pressure of 3  mmHg.    Patient Profile     57 y.o. male with a hx of hypertension who is being seen 07/01/2022 for the evaluation of EKG changes, deep T wave inversions in anterior leads at the request of anesthesia.Dr. Desmond Lope   Assessment & Plan    Abnormal EKG:  Trop trend not diagnostic of acute coronary event.  There is moderate LVH but no wall motion abnormalities and NL LVEF.  No further in patient work up.   Murmur is likely related to mild aortic valve calcification.  We will need to follow up on the moderate LVH as an out patient.  Likely HTN but will consider other etiologies and imaging as an outpatient.    For questions or updates, please contact [facility] Please consult [email or web address] for contact info under Cardiology/STEMI.   Signed, Rollene Rotunda, MD  07/02/2022, 9:43 AM

## 2022-07-03 ENCOUNTER — Encounter (HOSPITAL_COMMUNITY): Payer: Self-pay | Admitting: Neurosurgery

## 2022-07-03 LAB — TYPE AND SCREEN
Antibody Screen: NEGATIVE
Unit division: 0

## 2022-07-03 LAB — BPAM RBC
ISSUE DATE / TIME: 202404220749
ISSUE DATE / TIME: 202404220749
Unit Type and Rh: 5100
Unit Type and Rh: 5100

## 2022-07-03 MED ORDER — METOPROLOL TARTRATE 25 MG PO TABS
25.0000 mg | ORAL_TABLET | Freq: Four times a day (QID) | ORAL | Status: DC
  Administered 2022-07-03 – 2022-07-04 (×8): 25 mg via ORAL
  Filled 2022-07-03 (×9): qty 1

## 2022-07-03 MED FILL — Thrombin For Soln 5000 Unit: CUTANEOUS | Qty: 5000 | Status: AC

## 2022-07-03 NOTE — Progress Notes (Signed)
  Transition of Care [facility]) Screening Note   Patient Details  Name: Andrew Lawrence Date of Birth: [birth year]   Transition of Care [facility]) CM/SW Contact:    Mearl Latin, LCSW Phone Number: 07/03/2022, 10:16 AM    Transition of Care Department [facility]) has reviewed patient who is uninsured. We will continue to monitor patient advancement through interdisciplinary progression rounds. If new patient transition needs arise, please place a TOC consult.

## 2022-07-03 NOTE — Evaluation (Signed)
Occupational Therapy Evaluation Patient Details Name: Andrew Lawrence MRN: [id number] DOB: 1965-05-17 Today's Date: 07/03/2022   History of Present Illness Patient is a 57 yo male presenting to the hospital on 06/30/22 for tumor resection of left sphenoid meningioma. MRI on 06/15/22 showing significant associated edema and mass effect. PMH includes: HTN, seizures on Keppra (began 4/24)   Clinical Impression   Prior to this admission, patient had not worked in the last five months but assumed independent. Patient speaks [place]a, and interpreter services not available, therefore evaluation limited in scope. Patient presenting with questionable aphasia, dizziness and nausea. Patient min A of 2 due to impulsivity/language barrier however unable to progress ambulation due to pain, nausea, or dizziness. OT recommending outpatient OT at discharge; OT will continue to follow acutely.      Recommendations for follow up therapy are one component of a multi-disciplinary discharge planning process, led by the attending physician.  Recommendations may be updated based on patient status, additional functional criteria and insurance authorization.   Assistance Recommended at Discharge Frequent or constant Supervision/Assistance  Patient can return home with the following A little help with walking and/or transfers;A lot of help with bathing/dressing/bathroom;Direct supervision/assist for medications management;Direct supervision/assist for financial management;Assist for transportation;Help with stairs or ramp for entrance;Assistance with cooking/housework    Functional Status Assessment  Patient has had a recent decline in their functional status and demonstrates the ability to make significant improvements in function in a reasonable and predictable amount of time.  Equipment Recommendations  Other (comment) (Will continue to assess)    Recommendations for Other Services       Precautions / Restrictions  Precautions Precautions: Fall Restrictions Weight Bearing Restrictions: No      Mobility Bed Mobility Overal bed mobility: Needs Assistance Bed Mobility: Supine to Sit     Supine to sit: Min guard     General bed mobility comments: min gaurd for safety, minimally impulsive    Transfers Overall transfer level: Needs assistance Equipment used: 2 person hand held assist Transfers: Bed to chair/wheelchair/BSC, Sit to/from Stand Sit to Stand: Min assist, +2 safety/equipment, +2 physical assistance Stand pivot transfers: Min assist, +2 physical assistance, +2 safety/equipment         General transfer comment: Patient with 2 person HHA for patient, patient impulsive with movement, but hard to determine full function due to language barrier, patient unable to progress with mobilty, citing nausea, pain or dizziness      Balance Overall balance assessment: Needs assistance Sitting-balance support: Feet supported, Bilateral upper extremity supported Sitting balance-Leahy Scale: Fair     Standing balance support: During functional activity, Single extremity supported Standing balance-Leahy Scale: Fair Standing balance comment: will continue to assess                           ADL either performed or assessed with clinical judgement   ADL Overall ADL's : Needs assistance/impaired Eating/Feeding: Set up;Sitting                       Toilet Transfer: Minimal assistance;+2 for safety/equipment;+2 for physical assistance Toilet Transfer Details (indicate cue type and reason): min A to come into standing, grimacing when attempting to walk, returning to chair and unable to progress. Questionable nausea or dizziness `         Functional mobility during ADLs: Minimal assistance;+2 for physical assistance;+2 for safety/equipment General ADL Comments: Patient presenting with questionable aphasia, dizziness  and nausea. Patient session limited due to no interpreter  as patient speaks [place].     Vision Baseline Vision/History: 1 Wears glasses Vision Assessment?: Vision impaired- to be further tested in functional context Additional Comments: Language barrier making vision assessment difficult, noted swelling at L eye     Perception     Praxis      Pertinent Vitals/Pain Pain Assessment Pain Assessment: Faces Faces Pain Scale: Hurts little more Pain Location: grimacing with movement, gesuturing that he felt nauseaus or dizzy, difficult to interpret Pain Descriptors / Indicators: Discomfort, Grimacing, Guarding Pain Intervention(s): Limited activity within patient's tolerance, Monitored during session, Repositioned     Hand Dominance     Extremity/Trunk Assessment Upper Extremity Assessment Upper Extremity Assessment: Difficult to assess due to impaired cognition   Lower Extremity Assessment Lower Extremity Assessment: Defer to PT evaluation   Cervical / Trunk Assessment Cervical / Trunk Assessment: Normal   Communication Communication Communication: Expressive difficulties;Prefers language other than English (potential aphasia)   Cognition Arousal/Alertness: Lethargic Behavior During Therapy: Impulsive, Flat affect Overall Cognitive Status: Impaired/Different from baseline                                 General Comments: Difficult to assess, patient does not speak [place], patient's wife reports that he is not speaking like he typcially does     General Comments  BP stable and VSS on RA    Exercises     Shoulder Instructions      Home Living Family/patient expects to be discharged to:: Private residence Living Arrangements: Spouse/significant other;Children Available Help at Discharge: Family;Available 24 hours/day Type of Home: House       Home Layout: Multi-level                   Additional Comments: Minimal information obtained from patient and wife. Patient apparently has not worked in 5  months, however no [place] interpreter available therefore will need to reassess all aspects with interpretor services.      Prior Functioning/Environment Prior Level of Function : Patient poor historian/Family not available             Mobility Comments: Presumed independent, has not worked in 5 months, history difficult due to language barrier and possible aphasia ADLs Comments: Presumed independent, has not worked in 5 months, history difficult due to language barrier and possible aphasia        OT Problem List: Decreased strength;Decreased range of motion;Decreased activity tolerance;Impaired balance (sitting and/or standing);Impaired vision/perception;Decreased coordination;Decreased cognition;Decreased safety awareness;Decreased knowledge of use of DME or AE;Decreased knowledge of precautions;Pain      OT Treatment/Interventions: Self-care/ADL training;Therapeutic exercise;Neuromuscular education;Energy conservation;DME and/or AE instruction;Manual therapy;Therapeutic activities;Cognitive remediation/compensation;Visual/perceptual remediation/compensation;Patient/family education;Balance training    OT Goals(Current goals can be found in the care plan section) Acute Rehab OT Goals Patient Stated Goal: unable to state OT Goal Formulation: Patient unable to participate in goal setting Time For Goal Achievement: 07/17/22 Potential to Achieve Goals: Good  OT Frequency: Min 2X/week    Co-evaluation PT/OT/SLP Co-Evaluation/Treatment: Yes Reason for Co-Treatment: Necessary to address cognition/behavior during functional activity;For patient/therapist safety;To address functional/ADL transfers   OT goals addressed during session: ADL's and self-care;Strengthening/ROM      AM-PAC OT "6 Clicks" Daily Activity     Outcome Measure Help from another person eating meals?: A Little Help from another person taking care of personal grooming?: A Little Help from another person toileting,  which includes using toliet, bedpan, or urinal?: A Lot Help from another person bathing (including washing, rinsing, drying)?: A Lot Help from another person to put on and taking off regular upper body clothing?: A Little Help from another person to put on and taking off regular lower body clothing?: A Lot 6 Click Score: 15   End of Session Equipment Utilized During Treatment: Gait belt Nurse Communication: Mobility status;Other (comment) (Taken off of oxygen)  Activity Tolerance: Patient limited by pain;Patient limited by fatigue;Patient limited by lethargy Patient left: in chair;with call bell/phone within reach;with chair alarm set;with family/visitor present  OT Visit Diagnosis: Unsteadiness on feet (R26.81);Other abnormalities of gait and mobility (R26.89);Muscle weakness (generalized) (M62.81);Other symptoms and signs involving cognitive function;Pain Pain - part of body:  (Head)                Time: 1033-1050 OT Time Calculation (min): 17 min Charges:  OT General Charges $OT Visit: 1 Visit OT Evaluation $OT Eval Moderate Complexity: 1 Mod  Pollyann Glen E. Selmer Adduci, OTR/L Acute Rehabilitation Services [phone]   Cherlyn Cushing 07/03/2022, 11:24 AM

## 2022-07-03 NOTE — Progress Notes (Signed)
  NEUROSURGERY PROGRESS NOTE   Pt seen and examined. No issues overnight. Per RN, maybe was perseverating yesterday. Repeat CTH obtained overnight.  EXAM: Temp:  [97.8 F (36.6 C)-99 F (37.2 C)] 98.1 F (36.7 C) (04/29 0800) Pulse Rate:  [64-98] 69 (04/29 0900) Resp:  [0-35] 16 (04/29 0900) BP: (117-161)/(22-116) 144/22 (04/29 0900) SpO2:  [89 %-100 %] 97 % (04/29 0900) Intake/Output      04/28 0701 04/29 0700 04/29 0701 04/30 0700   I.V. (mL/kg) 1447.5 (12.4)    IV Piggyback 200    Total Intake(mL/kg) 1647.5 (14.2)    Urine (mL/kg/hr) 1350 (0.5)    Total Output 1350    Net +297.5         Urine Occurrence 4 x     Awake, alert, oriented to person/hospital Difficult to converse due to language barrier, speech is fluent CN intact MAE good strength  LABS: Lab Results  Component Value Date   CREATININE 0.80 06/15/2022   BUN 10 06/15/2022   NA 138 06/30/2022   K 3.3 (L) 06/30/2022   CL 101 06/15/2022   CO2 22 06/15/2022   Lab Results  Component Value Date   WBC 6.6 06/15/2022   HGB 12.6 (L) 06/30/2022   HCT 37.0 (L) 06/30/2022   MCV 81.8 06/15/2022   PLT 224 06/15/2022    IMAGING: Postop MRI and CT reviewed, expected postop changes with restricted diffusion around anterior temporal region and small portion of frontal operculum. No HCP.  IMPRESSION: - 57 y.o. male POD# 3 s/p left crani for meningioma. Appears well  PLAN: - d/c clevidipene, can add oral anti-hypertensive - Will attempt to get a Administrator, sports for better assessment of speech   Lisbeth Renshaw, MD [facility]

## 2022-07-03 NOTE — Evaluation (Signed)
Physical Therapy Evaluation Patient Details Name: Andrew Lawrence MRN: [id number] DOB: [birth year] Today's Date: 07/03/2022  History of Present Illness  Patient is a 57 yo male presenting to the hospital on 06/30/22 for tumor resection of left sphenoid meningioma. MRI on 06/15/22 showing significant associated edema and mass effect. PMH includes: HTN, seizures on Keppra (began 4/24)  Clinical Impression  Patient presents with decreased mobility due to generalized weakness, decreased balance, decreased activity tolerance seeming to indicate nausea, though also with some expressive aphasia though difficult to determine due to language barrier.  Patient currently min A of 2 for safety up to recliner noting no extremity weakness.  Likely will progress well with mobility, but currently recommending outpatient PT follow up for safety and progressing of cognition.      Recommendations for follow up therapy are one component of a multi-disciplinary discharge planning process, led by the attending physician.  Recommendations may be updated based on patient status, additional functional criteria and insurance authorization.  Follow Up Recommendations       Assistance Recommended at Discharge Intermittent Supervision/Assistance  Patient can return home with the following  A little help with walking and/or transfers;Assistance with cooking/housework;Help with stairs or ramp for entrance;Assist for transportation    Equipment Recommendations None recommended by PT  Recommendations for Other Services       Functional Status Assessment Patient has had a recent decline in their functional status and demonstrates the ability to make significant improvements in function in a reasonable and predictable amount of time.     Precautions / Restrictions Precautions Precautions: Fall Restrictions Weight Bearing Restrictions: No      Mobility  Bed Mobility Overal bed mobility: Needs Assistance Bed  Mobility: Supine to Sit     Supine to sit: Min guard     General bed mobility comments: min guard for safety, minimally impulsive    Transfers Overall transfer level: Needs assistance Equipment used: 2 person hand held assist Transfers: Bed to chair/wheelchair/BSC, Sit to/from Stand Sit to Stand: Min assist, +2 safety/equipment, +2 physical assistance Stand pivot transfers: Min assist, +2 physical assistance, +2 safety/equipment         General transfer comment: Patient with 2 person HHA for patient, patient impulsive with movement, but hard to determine full function due to language barrier, patient unable to progress with mobilty, citing nausea, pain or dizziness    Ambulation/Gait Ambulation/Gait assistance: Min assist, +2 safety/equipment Gait Distance (Feet): 5 Feet Assistive device: None Gait Pattern/deviations: Step-to pattern, Step-through pattern, Wide base of support       General Gait Details: some ambulation forward, then pt declined indicated did not feel well so backed up to recliner  Stairs            Wheelchair Mobility    Modified Rankin (Stroke Patients Only)       Balance Overall balance assessment: Needs assistance Sitting-balance support: Feet supported, Bilateral upper extremity supported Sitting balance-Leahy Scale: Good     Standing balance support: During functional activity, Single extremity supported Standing balance-Leahy Scale: Fair                               Pertinent Vitals/Pain Pain Assessment Faces Pain Scale: Hurts little more Pain Location: grimacing with movement, gesuturing that he felt nauseaus or dizzy, difficult to interpret Pain Descriptors / Indicators: Discomfort, Grimacing, Guarding Pain Intervention(s): Limited activity within patient's tolerance, Monitored during session    Home  Living Family/patient expects to be discharged to:: Private residence Living Arrangements: Spouse/significant  other;Children Available Help at Discharge: Family;Available 24 hours/day Type of Home: House         Home Layout: Multi-level   Additional Comments: Minimal information obtained from patient and wife. She reports home is three levels, however no [place] interpreter available therefore will need to reassess all aspects with interpretor services.    Prior Function Prior Level of Function : Patient poor historian/Family not available             Mobility Comments: Presumed independent, has not worked in 5 months, history difficult due to language barrier and possible aphasia ADLs Comments: Presumed independent, has not worked in 5 months, history difficult due to language barrier and possible aphasia     Hand Dominance        Extremity/Trunk Assessment   Upper Extremity Assessment Upper Extremity Assessment: Defer to OT evaluation    Lower Extremity Assessment Lower Extremity Assessment: Overall WFL for tasks assessed    Cervical / Trunk Assessment Cervical / Trunk Assessment: Other exceptions Cervical / Trunk Exceptions: L eye swollen shut  Communication   Communication: Expressive difficulties;Prefers language other than English (potential aphasia)  Cognition Arousal/Alertness: Awake/alert Behavior During Therapy: Impulsive, Flat affect Overall Cognitive Status: Impaired/Different from baseline                                 General Comments: Difficult to assess, patient does not speak [place], patient's wife reports that he is not speaking like he typcially does        General Comments General comments (skin integrity, edema, etc.): wife in the room and provideing some history, but she is also not native [place] speaker, reports he has not worked in about 5 months due to symptoms associated with tumor.  VSS on RA    Exercises     Assessment/Plan    PT Assessment Patient needs continued PT services  PT Problem List Decreased activity  tolerance;Decreased mobility;Decreased cognition;Decreased safety awareness       PT Treatment Interventions DME instruction;Functional mobility training;Balance training;Patient/family education;Therapeutic activities;Gait training;Stair training;Therapeutic exercise;Cognitive remediation    PT Goals (Current goals can be found in the Care Plan section)  Acute Rehab PT Goals Patient Stated Goal: to return to independent PT Goal Formulation: With patient/family Time For Goal Achievement: 07/17/22 Potential to Achieve Goals: Good    Frequency Min 4X/week     Co-evaluation PT/OT/SLP Co-Evaluation/Treatment: Yes Reason for Co-Treatment: Necessary to address cognition/behavior during functional activity;For patient/therapist safety;To address functional/ADL transfers PT goals addressed during session: Mobility/safety with mobility;Balance OT goals addressed during session: ADL's and self-care;Strengthening/ROM       AM-PAC PT "6 Clicks" Mobility  Outcome Measure Help needed turning from your back to your side while in a flat bed without using bedrails?: A Little Help needed moving from lying on your back to sitting on the side of a flat bed without using bedrails?: A Little Help needed moving to and from a bed to a chair (including a wheelchair)?: A Little Help needed standing up from a chair using your arms (e.g., wheelchair or bedside chair)?: A Little Help needed to walk in hospital room?: Total Help needed climbing 3-5 steps with a railing? : Total 6 Click Score: 14    End of Session Equipment Utilized During Treatment: Gait belt Activity Tolerance: Patient tolerated treatment well Patient left: in chair;with chair alarm  set;with family/visitor present   PT Visit Diagnosis: Other abnormalities of gait and mobility (R26.89);Other symptoms and signs involving the nervous system (R29.898)    Time: 1033-1050 PT Time Calculation (min) (ACUTE ONLY): 17 min   Charges:   PT  Evaluation $PT Eval Moderate Complexity: 1 Mod          Cyndi Sharanda Shinault, PT Acute Rehabilitation Services Office:[phone] 07/03/2022   Elray Mcgregor 07/03/2022, 11:46 AM

## 2022-07-03 NOTE — Progress Notes (Signed)
0830: Attempted to reach translator for pt. Interpreter service line was called. RN was informed that there was no [place] interpreter available at this time, requested to make an appointment to speak with one. Service line stated there was no one available at that time to make an appointment. Interpreter service informed RN to "try again later." RN requested other language on interpreting Ipad, and requested [place] interpreter. Translation services on Ipad informed RN that there were no interpreters that spoke [place] at that time and to "try again later". 1230: RN requested [place] interpreter on Science writer. Interpreter reached. Pt introduced to interpreter. RN asked pt his name, pt responded appropriately. Pt then asked his birth date, pt kept repeating his name. Pt then asked where he was, and again kept repeating his name. Interpreter thanked for her services. MD aware. Will monitor.   Reynold Bowen, RN BSN 07/03/2022 12:40 PM

## 2022-07-04 LAB — BASIC METABOLIC PANEL
Anion gap: 13 (ref 5–15)
BUN: 8 mg/dL (ref 6–20)
CO2: 24 mmol/L (ref 22–32)
Calcium: 8.6 mg/dL — ABNORMAL LOW (ref 8.9–10.3)
Chloride: 99 mmol/L (ref 98–111)
Creatinine, Ser: 0.75 mg/dL (ref 0.61–1.24)
GFR, Estimated: >60 mL/min (ref >60)
Glucose, Bld: 175 mg/dL — ABNORMAL HIGH (ref 70–99)
Potassium: 2.9 mmol/L — ABNORMAL LOW (ref 3.5–5.1)
Sodium: 136 mmol/L (ref 135–145)

## 2022-07-04 MED ORDER — POTASSIUM CHLORIDE CRYS ER 20 MEQ PO TBCR
40.0000 meq | EXTENDED_RELEASE_TABLET | Freq: Three times a day (TID) | ORAL | Status: DC
  Administered 2022-07-04 (×2): 40 meq via ORAL
  Filled 2022-07-04 (×3): qty 2

## 2022-07-04 NOTE — Progress Notes (Addendum)
Progress Note  Patient Name: Andrew Lawrence Date of Encounter: 07/04/2022  Primary Cardiologist:   Rollene Rotunda, MD   Subjective   Complains of headache.  No SOB.   Inpatient Medications    Scheduled Meds:  amLODipine  10 mg Oral Daily   Chlorhexidine Gluconate Cloth  6 each Topical Q0600   metoprolol tartrate  25 mg Oral QID   mouth rinse  15 mL Mouth Rinse 4 times per day   pantoprazole (PROTONIX) IV  40 mg Intravenous QHS   Continuous Infusions:  sodium chloride 75 mL/hr at 07/04/22 0239   levETIRAcetam Stopped (07/04/22 0700)   PRN Meds: acetaminophen **OR** acetaminophen, bisacodyl, HYDROcodone-acetaminophen, labetalol, morphine injection, ondansetron **OR** ondansetron (ZOFRAN) IV, mouth rinse, promethazine, senna-docusate   Vital Signs    Vitals:   07/03/22 2305 07/04/22 0000 07/04/22 0600 07/04/22 0802  BP: (!) 174/84 (!) 150/75 (!) 153/74 137/72  Pulse: 60  (!) 57 63  Resp: 16  12 18   Temp: 98.6 F (37 C)  98.7 F (37.1 C) 98.2 F (36.8 C)  TempSrc: Oral  Oral   SpO2: 99%  100% 97%  Weight:      Height:        Intake/Output Summary (Last 24 hours) at 07/04/2022 0831 Last data filed at 07/04/2022 0531 Gross per 24 hour  Intake 138.68 ml  Output 1850 ml  Net -1711.32 ml   Filed Weights   06/30/22 1051 06/30/22 2046  Weight: 117.5 kg 116.4 kg    Telemetry    NA - Personally Reviewed  ECG    NSR, rate 74, axis WNL, LVH with deep T wave inversion might represent repol although new compared with previous EKGs prior to this admission.  - Personally Reviewed  Physical Exam   GEN: No  acute distress.   Neck: No  JVD Cardiac: RRR, no murmurs, rubs, or gallops.  Respiratory: Clear   to auscultation bilaterally. GI: Soft, nontender, non-distended, normal bowel sounds  MS:  No edema; No deformity. Neuro:   Nonfocal  Psych: Oriented and appropriate    Labs    Chemistry Recent Labs  Lab 06/30/22 1756  NA 138  K 3.3*      Hematology Recent Labs  Lab 06/30/22 1756  HGB 12.6*  HCT 37.0*    Cardiac EnzymesNo results for input(s): "TROPONINI" in the last 168 hours. No results for input(s): "TROPIPOC" in the last 168 hours.   BNPNo results for input(s): "BNP", "PROBNP" in the last 168 hours.   DDimer No results for input(s): "DDIMER" in the last 168 hours.   Radiology    ECHOCARDIOGRAM COMPLETE  Result Date: 07/02/2022    ECHOCARDIOGRAM REPORT   Patient Name:   Andrew Lawrence Date of Exam: 07/02/2022 Medical Rec #:  [id number]         Height:       75.0 in Accession #:    [id number]        Weight:       256.6 lb Date of Birth:  1965-09-30          BSA:          2.440 m Patient Age:    57 years          BP:           120/41 mmHg Patient Gender: M                 HR:  74 bpm. Exam Location:  Inpatient Procedure: 2D Echo, Cardiac Doppler and Color Doppler Indications:    Chest Pain  History:        Patient has no prior history of Echocardiogram examinations.                 Risk Factors:Hypertension.  Sonographer:    Lucy Antigua Referring Phys: [id number] LAURA R INGOLD IMPRESSIONS  1. Left ventricular ejection fraction, by estimation, is 60 to 65%. The left ventricle has normal function. The left ventricle has no regional wall motion abnormalities. There is moderate left ventricular hypertrophy. Left ventricular diastolic parameters were normal.  2. Right ventricular systolic function is normal. The right ventricular size is normal.  3. The mitral valve is abnormal. Trivial mitral valve regurgitation. No evidence of mitral stenosis.  4. The aortic valve is tricuspid. There is mild calcification of the aortic valve. Aortic valve regurgitation is not visualized. Aortic valve sclerosis is present, with no evidence of aortic valve stenosis.  5. Aortic dilatation noted. There is mild dilatation of the ascending aorta, measuring 38 mm.  6. The inferior vena cava is normal in size with greater than 50% respiratory  variability, suggesting right atrial pressure of 3 mmHg. FINDINGS  Left Ventricle: Left ventricular ejection fraction, by estimation, is 60 to 65%. The left ventricle has normal function. The left ventricle has no regional wall motion abnormalities. The left ventricular internal cavity size was normal in size. There is  moderate left ventricular hypertrophy. Left ventricular diastolic parameters were normal. Right Ventricle: The right ventricular size is normal. No increase in right ventricular wall thickness. Right ventricular systolic function is normal. Left Atrium: Left atrial size was normal in size. Right Atrium: Right atrial size was normal in size. Pericardium: There is no evidence of pericardial effusion. Mitral Valve: The mitral valve is abnormal. There is mild thickening of the mitral valve leaflet(s). There is mild calcification of the mitral valve leaflet(s). Trivial mitral valve regurgitation. No evidence of mitral valve stenosis. Tricuspid Valve: The tricuspid valve is normal in structure. Tricuspid valve regurgitation is trivial. No evidence of tricuspid stenosis. Aortic Valve: The aortic valve is tricuspid. There is mild calcification of the aortic valve. Aortic valve regurgitation is not visualized. Aortic valve sclerosis is present, with no evidence of aortic valve stenosis. Aortic valve mean gradient measures 7.0 mmHg. Aortic valve peak gradient measures 15.5 mmHg. Aortic valve area, by VTI measures 3.37 cm. Pulmonic Valve: The pulmonic valve was normal in structure. Pulmonic valve regurgitation is trivial. No evidence of pulmonic stenosis. Aorta: Aortic dilatation noted. There is mild dilatation of the ascending aorta, measuring 38 mm. Venous: The inferior vena cava is normal in size with greater than 50% respiratory variability, suggesting right atrial pressure of 3 mmHg. IAS/Shunts: No atrial level shunt detected by color flow Doppler.  LEFT VENTRICLE PLAX 2D LVIDd:         5.40 cm      Diastology LVIDs:         3.00 cm     LV e' medial:    8.16 cm/s LV PW:         1.40 cm     LV E/e' medial:  13.6 LV IVS:        1.50 cm     LV e' lateral:   10.70 cm/s LVOT diam:     2.10 cm     LV E/e' lateral: 10.4 LV SV:         118  LV SV Index:   49 LVOT Area:     3.46 cm  LV Volumes (MOD) LV vol d, MOD A4C: 78.0 ml LV vol s, MOD A4C: 23.7 ml LV SV MOD A4C:     78.0 ml RIGHT VENTRICLE RV S prime:     20.80 cm/s TAPSE (M-mode): 2.7 cm LEFT ATRIUM             Index        RIGHT ATRIUM           Index LA Vol (A2C):   96.4 ml 39.51 ml/m  RA Area:     16.90 cm LA Vol (A4C):   81.7 ml 33.49 ml/m  RA Volume:   47.20 ml  19.35 ml/m LA Biplane Vol: 88.4 ml 36.24 ml/m  AORTIC VALVE AV Area (Vmax):    3.06 cm AV Area (Vmean):   3.20 cm AV Area (VTI):     3.37 cm AV Vmax:           197.00 cm/s AV Vmean:          117.000 cm/s AV VTI:            0.352 m AV Peak Grad:      15.5 mmHg AV Mean Grad:      7.0 mmHg LVOT Vmax:         174.00 cm/s LVOT Vmean:        108.000 cm/s LVOT VTI:          0.342 m LVOT/AV VTI ratio: 0.97  AORTA Ao Root diam: 3.60 cm Ao Asc diam:  3.80 cm MITRAL VALVE MV Area (PHT): 4.63 cm     SHUNTS MV Decel Time: 164 msec     Systemic VTI:  0.34 m MV E velocity: 111.00 cm/s  Systemic Diam: 2.10 cm MV A velocity: 77.10 cm/s MV E/A ratio:  1.44 Charlton Haws MD Electronically signed by Charlton Haws MD Signature Date/Time: 07/02/2022/11:22:39 AM    Final     Cardiac Studies   ECHO:   1. Left ventricular ejection fraction, by estimation, is 60 to 65%. The  left ventricle has normal function. The left ventricle has no regional  wall motion abnormalities. There is moderate left ventricular hypertrophy.  Left ventricular diastolic  parameters were normal.   2. Right ventricular systolic function is normal. The right ventricular  size is normal.   3. The mitral valve is abnormal. Trivial mitral valve regurgitation. No  evidence of mitral stenosis.   4. The aortic valve is tricuspid. There is  mild calcification of the  aortic valve. Aortic valve regurgitation is not visualized. Aortic valve  sclerosis is present, with no evidence of aortic valve stenosis.   5. Aortic dilatation noted. There is mild dilatation of the ascending  aorta, measuring 38 mm.   6. The inferior vena cava is normal in size with greater than 50%  respiratory variability, suggesting right atrial pressure of 3 mmHg.    Patient Profile     57 y.o. male with a hx of hypertension who is being seen 07/01/2022 for the evaluation of EKG changes, deep T wave inversions in anterior leads at the request of anesthesia.Dr. Desmond Lope   Assessment & Plan    Abnormal EKG:  Trop trend not diagnostic of acute coronary event.  There is moderate LVH but no wall motion abnormalities and NL LVEF.  No further work up.    HTN:   Metoprolol started yesterday.  Continue current dose.  Hypokalemia:  BMET ordered today.    Addendum: Potassium 2.9 noted.  I will supplement.  For questions or updates, please contact [facility] Please consult [email or web address] for contact info under Cardiology/STEMI.   Signed, Rollene Rotunda, MD  07/04/2022, 8:31 AM

## 2022-07-04 NOTE — Progress Notes (Signed)
Physical Therapy Treatment Patient Details Name: Andrew Lawrence MRN: [id number] DOB: Sep 05, 1965 Today's Date: 07/04/2022   History of Present Illness Patient is a 57 yo male presenting to the hospital on 06/30/22 for tumor resection of left sphenoid meningioma. MRI on 06/15/22 showing significant associated edema and mass effect. PMH includes: HTN, seizures on Keppra (began 4/24)    PT Comments    Received pt semi-reclined in bed asleep with lights out. Pt required increased time and encouragement from therapist and visitor to arouse. Pt performed bed mobility with supervision with HOB elevated and transfers without AD and min A fading to min guard from low sitting EOB. Pt ambulated 6ft x 1 and 17ft x 1 without AD and min A fading to min guard. Pt limited by headache, dizziness, and sensitivity to light - BP: 137/79. Suggested having visitor bring in sunglasses to allow pt to progress with therapy outside of room. Pt is progressing well with mobility - continue to recommend outpatient PT follow up to address safety, cognition, decreased balance, generalized weakness, and decreased endurance.     Recommendations for follow up therapy are one component of a multi-disciplinary discharge planning process, led by the attending physician.  Recommendations may be updated based on patient status, additional functional criteria and insurance authorization.  Follow Up Recommendations       Assistance Recommended at Discharge Intermittent Supervision/Assistance  Patient can return home with the following A little help with walking and/or transfers;Assistance with cooking/housework;Help with stairs or ramp for entrance;Assist for transportation   Equipment Recommendations  None recommended by PT    Recommendations for Other Services       Precautions / Restrictions Precautions Precautions: Fall Restrictions Weight Bearing Restrictions: No     Mobility  Bed Mobility Overal bed mobility: Needs  Assistance Bed Mobility: Supine to Sit, Sit to Supine, Rolling Rolling: Supervision   Supine to sit: Supervision, HOB elevated Sit to supine: Supervision, HOB elevated   General bed mobility comments: HOB elevated and slow to move, most likely due to headache Patient Response: Cooperative  Transfers Overall transfer level: Needs assistance Equipment used: None Transfers: Sit to/from Stand Sit to Stand: Min assist, Min guard           General transfer comment: Pt stood from low sitting EOB x 1 with min A and x 2 with min guard. Noted poor eccentric control upon returning to sitting on EOB.    Ambulation/Gait Ambulation/Gait assistance: Min assist, Min guard Gait Distance (Feet): 40 Feet (79ft + 82ft) Assistive device: None Gait Pattern/deviations: Step-through pattern, Wide base of support, Decreased step length - right, Decreased step length - left Gait velocity: decreased Gait velocity interpretation: <1.8 ft/sec, indicate of risk for recurrent falls   General Gait Details: pt ambulates with slow, cautious gait pattern. Was unable to progress to ambulation in hallway due to dizziness and light ensitivity.   Stairs             Wheelchair Mobility    Modified Rankin (Stroke Patients Only)       Balance Overall balance assessment: Needs assistance Sitting-balance support: Feet supported, Bilateral upper extremity supported Sitting balance-Leahy Scale: Good     Standing balance support: During functional activity, No upper extremity supported Standing balance-Leahy Scale: Fair Standing balance comment: pt able to maintain static standing balance with very close supervision, but required min guard/light min A for dynamic standing balance.  Cognition Arousal/Alertness: Awake/alert Behavior During Therapy: WFL for tasks assessed/performed Overall Cognitive Status: Difficult to assess                                  General Comments: difficult to assess congition due to language barrier, but overall WFL for simple one step commands        Exercises      General Comments General comments (skin integrity, edema, etc.): visitor present in room, enocuraging pt to participate with OOB mobility. BP: 137/79 after trial 1 ambulating. Pt reporting dizziness that did not appear to improve with increased time.      Pertinent Vitals/Pain Pain Assessment Pain Assessment: Faces Faces Pain Scale: Hurts little more Pain Location: grimacing with movement, gesuturing that his head hurt and feeling dizzy. Was able to commuincate basics without interpreter Pain Descriptors / Indicators: Discomfort, Grimacing, Guarding Pain Intervention(s): Limited activity within patient's tolerance, Monitored during session, Premedicated before session, Repositioned    Home Living                          Prior Function            PT Goals (current goals can now be found in the care plan section) Acute Rehab PT Goals Patient Stated Goal: to return to independent PT Goal Formulation: With patient/family Time For Goal Achievement: 07/17/22 Potential to Achieve Goals: Good Progress towards PT goals: Progressing toward goals    Frequency    Min 4X/week      PT Plan Current plan remains appropriate    Co-evaluation              AM-PAC PT "6 Clicks" Mobility   Outcome Measure  Help needed turning from your back to your side while in a flat bed without using bedrails?: A Little Help needed moving from lying on your back to sitting on the side of a flat bed without using bedrails?: A Little Help needed moving to and from a bed to a chair (including a wheelchair)?: A Little Help needed standing up from a chair using your arms (e.g., wheelchair or bedside chair)?: A Little Help needed to walk in hospital room?: A Little Help needed climbing 3-5 steps with a railing? : A Lot 6 Click Score: 17     End of Session Equipment Utilized During Treatment: Gait belt Activity Tolerance: Patient limited by pain Patient left: in bed;with call bell/phone within reach;with bed alarm set;with family/visitor present Nurse Communication: Mobility status PT Visit Diagnosis: Other abnormalities of gait and mobility (R26.89);Other symptoms and signs involving the nervous system (R29.898);Unsteadiness on feet (R26.81)     Time: 1130-1147 PT Time Calculation (min) (ACUTE ONLY): 17 min  Charges:  $Gait Training: 8-22 mins                     Raechel Chute PT, DPT  Alfonso Patten 07/04/2022, 11:58 AM

## 2022-07-04 NOTE — Progress Notes (Signed)
  NEUROSURGERY PROGRESS NOTE   No issues overnight.   EXAM:  BP (!) 163/67 (BP Location: Right Arm)   Pulse 73   Temp 98.2 F (36.8 C)   Resp 18   Ht 6\' 3"  (1.905 m)   Wt 116.4 kg   SpO2 98%   BMI 32.07 kg/m   Awake, alert, oriented  Speech fluent, still perseverates some  CN grossly intact  5/5 BUE/BLE  Wound c/d/I  IMPRESSION:  57 y.o. male s/p crani for resection of left sphenoid meningioma. Overall doing well with perhaps mild expressive dysphagia. HTN EKG change  PLAN: - Cont supportive care - SBP remains somewhat elevated despite addition of metoprolol. - Appreciate cardiology input. Does not appear to have had an acute coronary event. Suspect he can f/u as an outpatient - If we can control his blood pressure, can potentially d/c home tomorrow with outpatient SLP/PT/OT follow-up  Lisbeth Renshaw, MD [facility]
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
## 2022-07-04 NOTE — Plan of Care (Signed)
  Problem: Education: Goal: Knowledge of the prescribed therapeutic regimen Outcome: Progressing Goal: Knowledge of disease or condition will improve Outcome: Progressing   Problem: Clinical Measurements: Goal: Neurologic status will improve Outcome: Progressing   Problem: Tissue Perfusion: Goal: Ability to maintain intracranial pressure will improve Outcome: Progressing   Problem: Respiratory: Goal: Will regain and/or maintain adequate ventilation Outcome: Progressing   Problem: Skin Integrity: Goal: Risk for impaired skin integrity will decrease Outcome: Progressing Goal: Demonstration of wound healing without infection will improve Outcome: Progressing   Problem: Psychosocial: Goal: Ability to verbalize positive feelings about self will improve Outcome: Progressing Goal: Ability to participate in self-care as condition permits will improve Outcome: Progressing Goal: Ability to identify appropriate support needs will improve Outcome: Progressing   Problem: Health Behavior/Discharge Planning: Goal: Ability to manage health-related needs will improve Outcome: Progressing   Problem: Nutritional: Goal: Risk of aspiration will decrease Outcome: Progressing Goal: Dietary intake will improve Outcome: Progressing   Problem: Communication: Goal: Ability to communicate needs accurately will improve Outcome: Progressing   Problem: Education: Goal: Knowledge of General Education information will improve Description: Including pain rating scale, medication(s)/side effects and non-pharmacologic comfort measures Outcome: Progressing   Problem: Health Behavior/Discharge Planning: Goal: Ability to manage health-related needs will improve Outcome: Progressing   Problem: Clinical Measurements: Goal: Ability to maintain clinical measurements within normal limits will improve Outcome: Progressing Goal: Will remain free from infection Outcome: Progressing Goal: Diagnostic test  results will improve Outcome: Progressing Goal: Respiratory complications will improve Outcome: Progressing Goal: Cardiovascular complication will be avoided Outcome: Progressing   Problem: Activity: Goal: Risk for activity intolerance will decrease Outcome: Progressing   Problem: Nutrition: Goal: Adequate nutrition will be maintained Outcome: Progressing   Problem: Coping: Goal: Level of anxiety will decrease Outcome: Progressing   Problem: Elimination: Goal: Will not experience complications related to bowel motility Outcome: Progressing Goal: Will not experience complications related to urinary retention Outcome: Progressing   Problem: Pain Managment: Goal: General experience of comfort will improve Outcome: Progressing   Problem: Safety: Goal: Ability to remain free from injury will improve Outcome: Progressing   Problem: Skin Integrity: Goal: Risk for impaired skin integrity will decrease Outcome: Progressing   Problem: Education: Goal: Knowledge of the prescribed therapeutic regimen will improve Outcome: Progressing   Problem: Clinical Measurements: Goal: Usual level of consciousness will be regained or maintained. Outcome: Progressing Goal: Neurologic status will improve Outcome: Progressing Goal: Ability to maintain intracranial pressure will improve Outcome: Progressing   Problem: Skin Integrity: Goal: Demonstration of wound healing without infection will improve Outcome: Progressing
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
## 2022-07-05 ENCOUNTER — Telehealth: Payer: Self-pay | Admitting: *Deleted

## 2022-07-05 DIAGNOSIS — Z9889 Other specified postprocedural states: Secondary | ICD-10-CM

## 2022-07-05 DIAGNOSIS — D329 Benign neoplasm of meninges, unspecified: Secondary | ICD-10-CM

## 2022-07-05 LAB — BASIC METABOLIC PANEL
Anion gap: 7 (ref 5–15)
BUN: 6 mg/dL (ref 6–20)
CO2: 25 mmol/L (ref 22–32)
Calcium: 8.3 mg/dL — ABNORMAL LOW (ref 8.9–10.3)
Chloride: 103 mmol/L (ref 98–111)
Creatinine, Ser: 0.77 mg/dL (ref 0.61–1.24)
GFR, Estimated: >60 mL/min (ref >60)
Glucose, Bld: 96 mg/dL (ref 70–99)
Potassium: 3.1 mmol/L — ABNORMAL LOW (ref 3.5–5.1)
Sodium: 135 mmol/L (ref 135–145)

## 2022-07-05 MED ORDER — POTASSIUM CHLORIDE CRYS ER 20 MEQ PO TBCR
40.0000 meq | EXTENDED_RELEASE_TABLET | Freq: Two times a day (BID) | ORAL | Status: DC
  Administered 2022-07-05: 40 meq via ORAL

## 2022-07-05 MED ORDER — METOPROLOL SUCCINATE ER 100 MG PO TB24: 100.0000 mg | ORAL_TABLET | Freq: Every day | ORAL | 2 refills | Status: DC

## 2022-07-05 MED ORDER — METHYLPREDNISOLONE 4 MG PO TBPK: ORAL_TABLET | ORAL | 0 refills | Status: DC

## 2022-07-05 MED ORDER — HYDROCODONE-ACETAMINOPHEN 5-325 MG PO TABS: 1.0000 | ORAL_TABLET | ORAL | 0 refills | Status: DC | PRN

## 2022-07-05 MED ORDER — METOPROLOL TARTRATE 25 MG PO TABS: 25.0000 mg | ORAL_TABLET | Freq: Four times a day (QID) | ORAL | 1 refills | Status: DC

## 2022-07-05 MED ORDER — METOPROLOL SUCCINATE ER 100 MG PO TB24
100.0000 mg | ORAL_TABLET | Freq: Every day | ORAL | Status: DC
  Administered 2022-07-05: 100 mg via ORAL
  Filled 2022-07-05: qty 1

## 2022-07-05 NOTE — Progress Notes (Signed)
Physical Therapy Treatment Patient Details Name: Andrew Lawrence MRN: [id number] DOB: 12-18-1965 Today's Date: 07/05/2022   History of Present Illness Patient is a 57 yo male presenting to the hospital on 06/30/22 for tumor resection of left sphenoid meningioma. MRI on 06/15/22 showing significant associated edema and mass effect. PMH includes: HTN, seizures on Keppra (began 4/24)    PT Comments    Attempted for >10 min to obtain Hausa interpreter over phone, but no success today. Pt able to understand enough English to follow simple commands during session today. Pt can be self-limited by his headache pain, needing encouragement to continue to try to progress his mobility as tolerated. Pt was able to progress to ambulating up to ~160 ft in one bout without UE support and navigate x6 stairs with 1-2 handrail support all at a min guard assist level. Pt needed intermittent minA initially when ambulating due to shakiness, but this resolved with further mobility. Will continue to follow acutely.     Recommendations for follow up therapy are one component of a multi-disciplinary discharge planning process, led by the attending physician.  Recommendations may be updated based on patient status, additional functional criteria and insurance authorization.  Follow Up Recommendations       Assistance Recommended at Discharge Intermittent Supervision/Assistance  Patient can return home with the following A little help with walking and/or transfers;Assistance with cooking/housework;Help with stairs or ramp for entrance;Assist for transportation;A little help with bathing/dressing/bathroom   Equipment Recommendations  None recommended by PT    Recommendations for Other Services       Precautions / Restrictions Precautions Precautions: Fall Restrictions Weight Bearing Restrictions: No     Mobility  Bed Mobility Overal bed mobility: Needs Assistance Bed Mobility: Supine to Sit, Sit to Supine,  Rolling Rolling: Supervision   Supine to sit: Supervision, HOB elevated Sit to supine: Supervision, HOB elevated   General bed mobility comments: HOB elevated and slow to move, most likely due to headache, supervision for safety    Transfers Overall transfer level: Needs assistance Equipment used: None Transfers: Sit to/from Stand Sit to Stand: Min guard           General transfer comment: Extra time to come to stand, x1 from EOB and 1x from chair, min guard for safety, takes a moment in standing before proceeding to ambulating    Ambulation/Gait Ambulation/Gait assistance: Min assist, Min guard Gait Distance (Feet): 160 Feet (x2 bouts of ~50 ft > ~160 ft) Assistive device: None Gait Pattern/deviations: Step-through pattern, Wide base of support, Decreased step length - right, Decreased step length - left Gait velocity: decreased Gait velocity interpretation: <1.8 ft/sec, indicate of risk for recurrent falls   General Gait Details: Pt ambulating with slow, small cautious steps. Noted shakiness in legs with first bout and pt requesting to sit due to headache, min guard-minA. Upon second bout, no further shakiness noted but pt still limited by headache pain but able to progress further at an increased speed, min guard for safety   Stairs Stairs: Yes Stairs assistance: Min guard Stair Management: One rail Right, Two rails, Alternating pattern, Forwards Number of Stairs: 6 General stair comments: Uses R rail consistently and intermittently bil rail support, slow but steady ascension and descension of stairs, min guard for safety   Wheelchair Mobility    Modified Rankin (Stroke Patients Only)       Balance Overall balance assessment: Needs assistance Sitting-balance support: Feet supported, No upper extremity supported Sitting balance-Leahy Scale: Good  Standing balance support: No upper extremity supported, During functional activity Standing balance-Leahy Scale:  Fair Standing balance comment: Ambulates without UE support, uses rails on stairs                            Cognition Arousal/Alertness: Awake/alert Behavior During Therapy: Flat affect Overall Cognitive Status: Difficult to assess                                 General Comments: difficult to assess congition due to language barrier, but does follow simple commands with extra time and occasional repetition        Exercises      General Comments General comments (skin integrity, edema, etc.): educated wife to guard pt with standing, gait, and stairs, she verbalized understanding      Pertinent Vitals/Pain Pain Assessment Pain Assessment: Faces Faces Pain Scale: Hurts even more Pain Location: headache Pain Descriptors / Indicators: Discomfort, Grimacing, Headache Pain Intervention(s): Limited activity within patient's tolerance, Monitored during session, Repositioned, Other (comment) (notified RN)    Home Living                          Prior Function            PT Goals (current goals can now be found in the care plan section) Acute Rehab PT Goals Patient Stated Goal: to return to independent PT Goal Formulation: With patient/family Time For Goal Achievement: 07/17/22 Potential to Achieve Goals: Good Progress towards PT goals: Progressing toward goals    Frequency    Min 4X/week      PT Plan Current plan remains appropriate    Co-evaluation              AM-PAC PT "6 Clicks" Mobility   Outcome Measure  Help needed turning from your back to your side while in a flat bed without using bedrails?: A Little Help needed moving from lying on your back to sitting on the side of a flat bed without using bedrails?: A Little Help needed moving to and from a bed to a chair (including a wheelchair)?: A Little Help needed standing up from a chair using your arms (e.g., wheelchair or bedside chair)?: A Little Help needed to walk  in hospital room?: A Little Help needed climbing 3-5 steps with a railing? : A Little 6 Click Score: 18    End of Session Equipment Utilized During Treatment: Gait belt Activity Tolerance: Patient tolerated treatment well;Patient limited by pain Patient left: in bed;with call bell/phone within reach;with bed alarm set;with family/visitor present Nurse Communication: Mobility status;Other (comment) (headache) PT Visit Diagnosis: Other abnormalities of gait and mobility (R26.89);Other symptoms and signs involving the nervous system (R29.898);Unsteadiness on feet (R26.81)     Time: 9147-8295 PT Time Calculation (min) (ACUTE ONLY): 25 min  Charges:  $Gait Training: 23-37 mins                     Raymond Gurney, PT, DPT Acute Rehabilitation Services  Office: [phone]    Jewel Baize 07/05/2022, 12:22 PM

## 2022-07-05 NOTE — Progress Notes (Signed)
  NEUROSURGERY PROGRESS NOTE   No issues overnight. Has HA.  EXAM:  BP (!) 149/75 (BP Location: Right Arm)   Pulse (!) 53   Temp 98.6 F (37 C)   Resp 18   Ht 6\' 3"  (1.905 m)   Wt 116.4 kg   SpO2 97%   BMI 32.07 kg/m   Awake, alert, oriented  Speech fluent, less perseverating today CN grossly intact  5/5 BUE/BLE   IMPRESSION:  58 y.o. male POD# 5 s/p left crani for meningioma, doing well. Mild expressive aphasia appears to be improving.  PLAN: - Will d/c home today hopefully with outpatient SLP f/u   Lisbeth Renshaw, MD [facility]

## 2022-07-05 NOTE — TOC Transition Note (Signed)
Transition of Care [facility]) - CM/SW Discharge Note   Patient Details  Name: Andrew Lawrence MRN: [id number] Date of Birth: [birth year]  Transition of Care [facility] CM/SW Contact:  Kermit Balo, RN Phone Number: 07/05/2022, 10:02 AM   Clinical Narrative:    Pt is discharging home with outpatient therapy through [facility]. Information on the AVS.  Pt without health insurance but has PCP.  Pt has transportation home.    Final next level of care: OP Rehab Barriers to Discharge: No Barriers Identified   Patient Goals and CMS Choice      Discharge Placement                         Discharge Plan and Services Additional resources added to the After Visit Summary for                                       Social Determinants of Health (SDOH) Interventions SDOH Screenings   Tobacco Use: Medium Risk (07/03/2022)     Readmission Risk Interventions     No data to display

## 2022-07-05 NOTE — Plan of Care (Signed)
  Problem: Education: Goal: Knowledge of the prescribed therapeutic regimen Outcome: Adequate for Discharge Goal: Knowledge of disease or condition will improve Outcome: Adequate for Discharge   Problem: Clinical Measurements: Goal: Neurologic status will improve Outcome: Adequate for Discharge   Problem: Tissue Perfusion: Goal: Ability to maintain intracranial pressure will improve Outcome: Adequate for Discharge   Problem: Respiratory: Goal: Will regain and/or maintain adequate ventilation Outcome: Adequate for Discharge   Problem: Skin Integrity: Goal: Risk for impaired skin integrity will decrease Outcome: Adequate for Discharge Goal: Demonstration of wound healing without infection will improve Outcome: Adequate for Discharge   Problem: Psychosocial: Goal: Ability to verbalize positive feelings about self will improve Outcome: Adequate for Discharge Goal: Ability to participate in self-care as condition permits will improve Outcome: Adequate for Discharge Goal: Ability to identify appropriate support needs will improve Outcome: Adequate for Discharge   Problem: Health Behavior/Discharge Planning: Goal: Ability to manage health-related needs will improve Outcome: Adequate for Discharge   Problem: Nutritional: Goal: Risk of aspiration will decrease Outcome: Adequate for Discharge Goal: Dietary intake will improve Outcome: Adequate for Discharge   Problem: Communication: Goal: Ability to communicate needs accurately will improve Outcome: Adequate for Discharge   Problem: Education: Goal: Knowledge of General Education information will improve Description: Including pain rating scale, medication(s)/side effects and non-pharmacologic comfort measures Outcome: Adequate for Discharge   Problem: Health Behavior/Discharge Planning: Goal: Ability to manage health-related needs will improve Outcome: Adequate for Discharge   Problem: Clinical Measurements: Goal:  Ability to maintain clinical measurements within normal limits will improve Outcome: Adequate for Discharge Goal: Will remain free from infection Outcome: Adequate for Discharge Goal: Diagnostic test results will improve Outcome: Adequate for Discharge Goal: Respiratory complications will improve Outcome: Adequate for Discharge Goal: Cardiovascular complication will be avoided Outcome: Adequate for Discharge   Problem: Activity: Goal: Risk for activity intolerance will decrease Outcome: Adequate for Discharge   Problem: Nutrition: Goal: Adequate nutrition will be maintained Outcome: Adequate for Discharge   Problem: Coping: Goal: Level of anxiety will decrease Outcome: Adequate for Discharge   Problem: Elimination: Goal: Will not experience complications related to bowel motility Outcome: Adequate for Discharge Goal: Will not experience complications related to urinary retention Outcome: Adequate for Discharge   Problem: Pain Managment: Goal: General experience of comfort will improve Outcome: Adequate for Discharge   Problem: Safety: Goal: Ability to remain free from injury will improve Outcome: Adequate for Discharge   Problem: Skin Integrity: Goal: Risk for impaired skin integrity will decrease Outcome: Adequate for Discharge   Problem: Education: Goal: Knowledge of the prescribed therapeutic regimen will improve Outcome: Adequate for Discharge   Problem: Clinical Measurements: Goal: Usual level of consciousness will be regained or maintained. Outcome: Adequate for Discharge Goal: Neurologic status will improve Outcome: Adequate for Discharge Goal: Ability to maintain intracranial pressure will improve Outcome: Adequate for Discharge   Problem: Skin Integrity: Goal: Demonstration of wound healing without infection will improve Outcome: Adequate for Discharge

## 2022-07-05 NOTE — Telephone Encounter (Signed)
-----   Message from Angela Nicole Duke, PA sent at 07/05/2022  4:23 PM EDT ----- Can you please help me arrange a follow up with a pharmD in [facility] clinic at [facility]?    Thank you Angie     

## 2022-07-05 NOTE — Progress Notes (Signed)
Progress Note  Patient Name: Andrew Lawrence Date of Encounter: 07/05/2022  Primary Cardiologist:   Rollene Rotunda, MD   Subjective   No chest pain.  Still with headache  Inpatient Medications    Scheduled Meds:  amLODipine  10 mg Oral Daily   Chlorhexidine Gluconate Cloth  6 each Topical Q0600   metoprolol tartrate  25 mg Oral QID   mouth rinse  15 mL Mouth Rinse 4 times per day   pantoprazole (PROTONIX) IV  40 mg Intravenous QHS   potassium chloride  40 mEq Oral TID   Continuous Infusions:  sodium chloride 75 mL/hr at 07/04/22 0239   levETIRAcetam Stopped (07/04/22 2130)   PRN Meds: acetaminophen **OR** acetaminophen, bisacodyl, HYDROcodone-acetaminophen, labetalol, morphine injection, ondansetron **OR** ondansetron (ZOFRAN) IV, mouth rinse, promethazine, senna-docusate   Vital Signs    Vitals:   07/04/22 2110 07/04/22 2111 07/05/22 0319 07/05/22 0819  BP: (!) 148/75 (!) 148/75 137/81 (!) 149/75  Pulse:  (!) 58 62 (!) 53  Resp:  18 18 18   Temp:  98.1 F (36.7 C) 97.9 F (36.6 C) 98.6 F (37 C)  TempSrc:  Oral Oral   SpO2:  100% 100% 97%  Weight:      Height:        Intake/Output Summary (Last 24 hours) at 07/05/2022 1052 Last data filed at 07/05/2022 0534 Gross per 24 hour  Intake 299.83 ml  Output --  Net 299.83 ml   Filed Weights   06/30/22 1051 06/30/22 2046  Weight: 117.5 kg 116.4 kg    Telemetry    NA - Personally Reviewed  ECG    NA  Physical Exam   GEN: No  acute distress.   Neck: No  JVD Cardiac: RRR, no murmurs, rubs, or gallops.  Respiratory: Clear   to auscultation bilaterally. GI: Soft, nontender, non-distended, normal bowel sounds  MS:  No edema; No deformity. Neuro:   Nonfocal  Psych: Oriented and appropriate     Labs    Chemistry Recent Labs  Lab 06/30/22 1756 07/04/22 1037 07/05/22 0709  NA 138 136 135  K 3.3* 2.9* 3.1*  CL  --  99 103  CO2  --  24 25  GLUCOSE  --  175* 96  BUN  --  8 6  CREATININE  --   0.75 0.77  CALCIUM  --  8.6* 8.3*  GFRNONAA  --  >60 >60  ANIONGAP  --  13 7     Hematology Recent Labs  Lab 06/30/22 1756  HGB 12.6*  HCT 37.0*    Cardiac EnzymesNo results for input(s): "TROPONINI" in the last 168 hours. No results for input(s): "TROPIPOC" in the last 168 hours.   BNPNo results for input(s): "BNP", "PROBNP" in the last 168 hours.   DDimer No results for input(s): "DDIMER" in the last 168 hours.   Radiology    No results found.  Cardiac Studies   ECHO:   1. Left ventricular ejection fraction, by estimation, is 60 to 65%. The  left ventricle has normal function. The left ventricle has no regional  wall motion abnormalities. There is moderate left ventricular hypertrophy.  Left ventricular diastolic  parameters were normal.   2. Right ventricular systolic function is normal. The right ventricular  size is normal.   3. The mitral valve is abnormal. Trivial mitral valve regurgitation. No  evidence of mitral stenosis.   4. The aortic valve is tricuspid. There is mild calcification of the  aortic  valve. Aortic valve regurgitation is not visualized. Aortic valve  sclerosis is present, with no evidence of aortic valve stenosis.   5. Aortic dilatation noted. There is mild dilatation of the ascending  aorta, measuring 38 mm.   6. The inferior vena cava is normal in size with greater than 50%  respiratory variability, suggesting right atrial pressure of 3 mmHg.    Patient Profile     57 y.o. male with a hx of hypertension who is being seen 07/01/2022 for the evaluation of EKG changes, deep T wave inversions in anterior leads at the request of anesthesia.Dr. Desmond Lope   Assessment & Plan    Abnormal EKG:  Trop trend not diagnostic of acute coronary event.  There is moderate LVH but no wall motion abnormalities and NL LVEF.  No further work up.   HTN:   I will consolidate the beta blocker to XL today.  Further med adjustments will be based on BP readings over the  next 24 hours.   We will arrange follow up in our HTN clinic.  I have asked them to keep a BP diary 2 x per day and bring with them to the follow up appt.   Hypokalemia:  BMET ordered today.    I only see two doses of potassium given yesterday for total of 80 meq.  Potassium is still low.  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     For questions or updates, please contact [facility] Please consult [email or web address] for contact info under Cardiology/STEMI.   Signed, Rollene Rotunda, MD  07/05/2022, 10:52 AM

## 2022-07-05 NOTE — Discharge Summary (Addendum)
Physician Discharge Summary  Patient ID: HARVEER SADLER MRN: [id number] DOB/AGE: 1966/01/31 57 y.o.  Admit date: 06/30/2022 Discharge date: 07/05/2022  Admission Diagnoses:  Meningioma  Discharge Diagnoses:  Same Principal Problem:   S/P craniotomy Active Problems:   Meningioma [facility])   Discharged Condition: Stable  Hospital Course:  Andrew Lawrence is a 57 y.o. male admitted for elective left craniotomy for resection of meningioma.  He initially presented with a seizure.  He was at neurologic baseline postoperatively, complaining of appropriate headache.  He was noted to have some very mild perseveration which appeared to improve over his hospital course.  Postoperative MRI revealed good resection of the tumor with small residual within the cavernous sinus as expected.  He was monitored in the intensive care unit and subsequently in the general neuroscience floor without change in his condition.  He did have some T wave inversion intraoperatively and was seen by cardiology postoperatively, not felt to be indicative of acute coronary syndrome.  Outpatient cardiology follow-up was recommended.   Treatments: Surgery -left frontotemporal craniotomy for resection of meningioma  Discharge Exam: Blood pressure (!) 148/72, pulse (!) 55, temperature 98.2 F (36.8 C), temperature source Oral, resp. rate 18, height 6\' 3"  (1.905 m), weight 116.4 kg, SpO2 97 %. Awake, alert, oriented Speech fluent, appropriate CN grossly intact 5/5 BUE/BLE Wound c/d/i  Disposition: Discharge disposition: 01-Home or Self Care       Discharge Instructions     Ambulatory referral to Occupational Therapy   Complete by: As directed    Ambulatory referral to Physical Therapy   Complete by: As directed    Ambulatory referral to Speech Therapy   Complete by: As directed    Call MD for:  redness, tenderness, or signs of infection (pain, swelling, redness, odor or green/yellow discharge around incision  site)   Complete by: As directed    Call MD for:  temperature >100.4   Complete by: As directed    Diet - low sodium heart healthy   Complete by: As directed    Discharge instructions   Complete by: As directed    Walk at home as much as possible, at least 4 times / day   Discharge wound care:   Complete by: As directed    Keep wound clean. Can shower at home   Increase activity slowly   Complete by: As directed    Lifting restrictions   Complete by: As directed    No lifting > 10 lbs   May shower / Bathe   Complete by: As directed    48 hours after surgery   May walk up steps   Complete by: As directed    Other Restrictions   Complete by: As directed    No bending/twisting at waist      Allergies as of 07/05/2022   No Known Allergies      Medication List     STOP taking these medications    testosterone cypionate 200 MG/ML injection Commonly known as: DEPOTESTOSTERONE CYPIONATE       TAKE these medications    amLODipine 10 MG tablet Commonly known as: NORVASC Take 10 mg by mouth daily.   HYDROcodone-acetaminophen 5-325 MG tablet Commonly known as: NORCO/VICODIN Take 1 tablet by mouth every 4 (four) hours as needed for moderate pain.   levETIRAcetam 500 MG tablet Commonly known as: KEPPRA Take 1 tablet (500 mg total) by mouth 2 (two) times daily.   methylPREDNISolone 4 MG Tbpk tablet Commonly known  as: MEDROL DOSEPAK Take as directed on package   metoprolol succinate 100 MG 24 hr tablet Commonly known as: TOPROL-XL Take 1 tablet (100 mg total) by mouth daily. Take with or immediately following a meal. Start taking on: Jul 06, 2022   TYLENOL PO Take 2 tablets by mouth daily as needed (headache).               Discharge Care Instructions  (From admission, onward)           Start     Ordered   07/05/22 0000  Discharge wound care:       Comments: Keep wound clean. Can shower at home   07/05/22 1610            Follow-up  Information     Ronney Asters, NP Follow up on 07/25/2022.   Specialty: Cardiology Why: at 8:25am for your cardiology follow up appointment Contact information: [address] [place] [address] [phone]         Lisbeth Renshaw, MD Follow up in 2 week(s).   Specialty: Neurosurgery Why: For staple removal, For wound re-check Contact information: [address] [place] [address] [phone]         [facility]. Schedule an appointment as soon as possible for a visit in 1 week(s).   Specialty: Rehabilitation Contact information: [address] [id number] mc [place] [address] [phone]        Rometta Emery, MD. Schedule an appointment as soon as possible for a visit in 2 week(s).   Specialty: Internal Medicine Why: Needs BMP at this visit Contact information: [address] [place] [address] [phone]                 Signed: Jackelyn Hoehn 07/05/2022, 4:46 PM

## 2022-07-06 ENCOUNTER — Telehealth: Payer: Self-pay

## 2022-07-06 NOTE — Telephone Encounter (Signed)
-----   Message from Marcelino Duster, [place] sent at 07/05/2022  4:23 PM EDT ----- Can you please help me arrange a follow up with a pharmD in HTN clinic at [facility]?    Thank you Angie

## 2022-07-06 NOTE — Telephone Encounter (Signed)
Left message to call back.  Scheduled 1st available 6-6 @ 830. Will await call back.

## 2022-07-11 NOTE — Addendum Note (Signed)
Addended by: Tobin Chad on: 07/11/2022 10:45 AM   Modules accepted: Orders

## 2022-07-11 NOTE — Telephone Encounter (Signed)
Patient received appointment see 07/06/22 telephone note.

## 2022-07-12 NOTE — Telephone Encounter (Signed)
Unable to contact pt, letter mailed

## 2022-07-13 LAB — SURGICAL PATHOLOGY

## 2022-07-20 ENCOUNTER — Encounter: Payer: Self-pay | Admitting: Occupational Therapy

## 2022-07-20 ENCOUNTER — Ambulatory Visit: Payer: Medicaid Other

## 2022-07-20 ENCOUNTER — Encounter: Payer: Self-pay | Admitting: Speech Pathology

## 2022-07-20 ENCOUNTER — Ambulatory Visit: Payer: Medicaid Other | Attending: Neurosurgery | Admitting: Occupational Therapy

## 2022-07-20 ENCOUNTER — Other Ambulatory Visit: Payer: Self-pay

## 2022-07-20 ENCOUNTER — Ambulatory Visit: Payer: Medicaid Other | Admitting: Speech Pathology

## 2022-07-20 VITALS — BP 142/90

## 2022-07-20 DIAGNOSIS — Z9889 Other specified postprocedural states: Secondary | ICD-10-CM

## 2022-07-20 DIAGNOSIS — M6281 Muscle weakness (generalized): Secondary | ICD-10-CM | POA: Insufficient documentation

## 2022-07-20 DIAGNOSIS — D329 Benign neoplasm of meninges, unspecified: Secondary | ICD-10-CM | POA: Diagnosis not present

## 2022-07-20 DIAGNOSIS — R2689 Other abnormalities of gait and mobility: Secondary | ICD-10-CM

## 2022-07-20 DIAGNOSIS — R262 Difficulty in walking, not elsewhere classified: Secondary | ICD-10-CM | POA: Insufficient documentation
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
## 2022-07-25 ENCOUNTER — Ambulatory Visit: Payer: Self-pay | Attending: General Practice | Admitting: General Practice

## 2022-08-10 ENCOUNTER — Ambulatory Visit: Payer: Self-pay | Attending: Cardiology

## 2022-08-10 NOTE — Progress Notes (Deleted)
   Office Visit    Patient Name: Andrew Lawrence Date of Encounter: 08/10/2022  Primary Care Provider:  Rometta Emery, MD Primary Cardiologist:  Rollene Rotunda, MD  Chief Complaint    Hypertension  Significant Past Medical History                    No Known Allergies  History of Present Illness    Andrew Lawrence is a 57 y.o. male patient of Dr Antoine Poche,  Blood Pressure Goal:  130/80  Current Medications:  amlodipine 10 mg qd, metoprolol succ 100 mg qd  Previously tried:    Family Hx:     Social Hx:      Tobacco:  Alcohol:  Caffeine: Diet:      Exercise:   Home BP readings:      Adherence Assessment  Do you ever forget to take your medication? [] Yes [] No  Do you ever skip doses due to side effects? [] Yes [] No  Do you have trouble affording your medicines? [] Yes [] No  Are you ever unable to pick up your medication due to transportation difficulties? [] Yes [] No  Do you ever stop taking your medications because you don't believe they are helping? [] Yes [] No  Do you check your weight daily? [] Yes [] No   Adherence strategy: ***  Barriers to obtaining medications: ***     Accessory Clinical Findings    Lab Results  Component Value Date   CREATININE 0.77 07/05/2022   BUN 6 07/05/2022   NA 135 07/05/2022   K 3.1 (L) 07/05/2022   CL 103 07/05/2022   CO2 25 07/05/2022   Lab Results  Component Value Date   ALT 50 (H) 06/15/2022   AST 37 06/15/2022   ALKPHOS 54 06/15/2022   BILITOT 1.9 (H) 06/15/2022   No results found for: "HGBA1C"  Home Medications    Current Outpatient Medications  Medication Sig Dispense Refill   Acetaminophen (TYLENOL PO) Take 2 tablets by mouth daily as needed (headache).     amLODipine (NORVASC) 10 MG tablet Take 10 mg by mouth daily.     HYDROcodone-acetaminophen (NORCO/VICODIN) 5-325 MG tablet Take 1 tablet by mouth every 4 (four) hours as needed for moderate pain. 30 tablet 0   levETIRAcetam (KEPPRA)  500 MG tablet Take 1 tablet (500 mg total) by mouth 2 (two) times daily. 60 tablet 3   methylPREDNISolone (MEDROL DOSEPAK) 4 MG TBPK tablet Take as directed on package 21 each 0   metoprolol succinate (TOPROL-XL) 100 MG 24 hr tablet Take 1 tablet (100 mg total) by mouth daily. Take with or immediately following a meal. 30 tablet 2   No current facility-administered medications for this visit.     No BP recorded.  {Refresh Note OR Click here to enter BP  :1}***   Assessment & Plan    No problem-specific Assessment & Plan notes found for this encounter.   Phillips Hay PharmD CPP [facility]  [address] [place], [place] [address] [phone]

## 2022-08-11 ENCOUNTER — Encounter: Payer: Self-pay | Admitting: Cardiology
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
## 2022-09-14 ENCOUNTER — Ambulatory Visit: Payer: Medicaid Other | Attending: Internal Medicine | Admitting: Student

## 2022-09-14 ENCOUNTER — Encounter: Payer: Self-pay | Admitting: Student

## 2022-09-14 VITALS — BP 126/84 | HR 62

## 2022-09-14 DIAGNOSIS — I10 Essential (primary) hypertension: Secondary | ICD-10-CM | POA: Insufficient documentation

## 2022-09-14 NOTE — Patient Instructions (Signed)
No Changes made to your medication by your pharmacist Carmela Hurt, PharmD at today's visit:  Buy omron brand BP monitor to check BP at home.  Bring all of your meds, your BP cuff and your record of home blood pressures to your next appointment.    HOW TO TAKE YOUR BLOOD PRESSURE AT HOME  Rest 5 minutes before taking your blood pressure.  Don't smoke or drink caffeinated beverages for at least 30 minutes before. Take your blood pressure before (not after) you eat. Sit comfortably with your back supported and both feet on the floor (don't cross your legs). Elevate your arm to heart level on a table or a desk. Use the proper sized cuff. It should fit smoothly and snugly around your bare upper arm. There should be enough room to slip a fingertip under the cuff. The bottom edge of the cuff should be 1 inch above the crease of the elbow. Ideally, take 3 measurements at one sitting and record the average.  Important lifestyle changes to control high blood pressure  Intervention  Effect on the BP  Lose extra pounds and watch your waistline Weight loss is one of the most effective lifestyle changes for controlling blood pressure. If you're overweight or obese, losing even a small amount of weight can help reduce blood pressure. Blood pressure might go down by about 1 millimeter of mercury (mm Hg) with each kilogram (about 2.2 pounds) of weight lost.  Exercise regularly As a general goal, aim for at least 30 minutes of moderate physical activity every day. Regular physical activity can lower high blood pressure by about 5 to 8 mm Hg.  Eat a healthy diet Eating a diet rich in whole grains, fruits, vegetables, and low-fat dairy products and low in saturated fat and cholesterol. A healthy diet can lower high blood pressure by up to 11 mm Hg.  Reduce salt (sodium) in your diet Even a small reduction of sodium in the diet can improve heart health and reduce high blood pressure by about 5 to 6 mm Hg.   Limit alcohol One drink equals 12 ounces of beer, 5 ounces of wine, or 1.5 ounces of 80-proof liquor.  Limiting alcohol to less than one drink a day for women or two drinks a day for men can help lower blood pressure by about 4 mm Hg.   If you have any questions or concerns please use My Chart to send questions or call the office at [phone]

## 2022-09-14 NOTE — Assessment & Plan Note (Addendum)
Assessment: In office 1st BP measurement was 141/89 and 2nd reading 15 min later 126/84 (goal <130/80) Takes amlodipine 10 mg daily and metoprolol XL 100 mg daily regularly and toelrates them well he has occasional dizziness while sitting and headaches. He is tired mot of the time. Denies swelling, palpitation, or SOB Walks 20 min every day and does not watch salt intake     Plan:  No medication changes  Continue taking amlodipine 10 mg daily and metoprolol Xl 100 mg daily  Reiterated the importance of regular exercise and low salt diet  Patient to get new home BP monitor and bring in BP monitor for validation  Patient to keep record of BP readings with heart rate and report to [place] at the next visit  In future can consider adding ARB to optimize BP Patient to see PharmD in 4 weeks for follow up  Follow up lab(s) : none

## 2022-09-14 NOTE — Progress Notes (Signed)
H&V Care Navigation CSW Progress Note  Clinical Social Worker met with patient to f/u on referral for being uninsured. Completed brief review of chart account notes. Pt previously screened for Medicaid, did not complete Medicaid application/provide all needed documents so noted as "failure to comply" meaning he is not eligible for CAFA until screened ineligible for Medicaid.  LCSW met with pt and his son, Ahmed, updated chart to reflect current address, PCP, emergency contacts and phone number. I explained the above. Pt agreeable to referral to [facility] to discuss Medicaid application. Understands cannot complete additional assistance until screened ineligible for Medicaid. Pt states understanding. Was given Medicaid information and my card. I will f/u with pt. Referral sent to Cathlyn Parsons, [facility] DSS Medicaid caseworker at satellite office at [facility].   Patient is participating in a Managed Medicaid Plan:  No, self pay only.  SDOH Screenings   Food Insecurity: No Food Insecurity (09/14/2022)  Housing: Low Risk  (09/14/2022)  Transportation Needs: No Transportation Needs (09/14/2022)  Utilities: Not At Risk (09/14/2022)  Financial Resource Strain: Medium Risk (09/14/2022)  Tobacco Use: Medium Risk (07/20/2022)   Octavio Graves, MSW, LCSW Clinical Social Worker II [facility] Heart/Vascular Care Navigation  [phone]- work cell phone (preferred) [phone]- desk phone

## 2022-09-18 ENCOUNTER — Telehealth: Payer: Self-pay | Admitting: Licensed Clinical Social Worker

## 2022-09-18 NOTE — Telephone Encounter (Signed)
H&V Care Navigation CSW Progress Note  Clinical Social Worker contacted patient by phone to f/u after appt. Received the following from Cathlyn Parsons, DSS caseworker "Andrew Lawrence DOB 07/15/1965: approved for full Medicaid effective 08/05/22 - 08/04/23 and retroactive full Medicaid for 06/05/22 - 08/04/22.  His Medicaid ID # is [id number] M".  Was able to locate this in [employer], asked financial counseling to add to account and process retro bills. I was able to reach pt at [phone], explained the above. He is very Adult nurse for update. I mailed confirmation from [facility] for him to bring with him until [facility] card received. No additional questions at this time.   Patient is participating in a Managed Medicaid Plan:  Yes- [employer] Medicaid  SDOH Screenings   Food Insecurity: No Food Insecurity (09/14/2022)  Housing: Low Risk  (09/14/2022)  Transportation Needs: No Transportation Needs (09/14/2022)  Utilities: Not At Risk (09/14/2022)  Financial Resource Strain: Medium Risk (09/14/2022)  Tobacco Use: Medium Risk (09/14/2022)   Andrew Lawrence, MSW, LCSW Clinical Social Worker II [facility] Heart/Vascular Care Navigation  [phone]- work cell phone (preferred) [phone]- desk phone

## 2022-10-10 ENCOUNTER — Ambulatory Visit: Payer: Medicaid Other | Attending: Internal Medicine | Admitting: Student

## 2022-10-10 ENCOUNTER — Encounter: Payer: Self-pay | Admitting: Student

## 2022-10-10 VITALS — BP 125/78 | HR 61 | Ht 75.0 in | Wt 253.6 lb

## 2022-10-10 DIAGNOSIS — E669 Obesity, unspecified: Secondary | ICD-10-CM | POA: Diagnosis not present

## 2022-10-10 LAB — LIPID PANEL
Chol/HDL Ratio: 3.7 ratio (ref 0.0–5.0)
Cholesterol, Total: 177 mg/dL (ref 100–199)
HDL: 48 mg/dL (ref >39)
LDL Chol Calc (NIH): 110 mg/dL — ABNORMAL HIGH (ref 0–99)
Triglycerides: 106 mg/dL (ref 0–149)
VLDL Cholesterol Cal: 19 mg/dL (ref 5–40)

## 2022-10-10 MED ORDER — BLOOD PRESSURE MONITOR/L CUFF MISC: 0 refills | Status: DC
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
## 2022-10-10 NOTE — Assessment & Plan Note (Addendum)
Assessment: In office BP 125/78 heart rate 61 (goal <130/80) Takes amlodipine 10 mg daily and metoprolol XL 100 mg daily regularly and toelrates them well Denies swelling, palpitation, dizziness or SOB Walks 35-45 min every day  Has cut down on salt intake significantly   Will see if medicaid covers home BP monitor or not- will call pharmacy to find out   Plan:  No medication changes  Continue taking amlodipine 10 mg daily and metoprolol Xl 100 mg daily  In future can consider adding thiazide to optimize BP Patient to see PharmD in 6 weeks for follow up  Follow up lab(s) : none

## 2022-10-10 NOTE — Patient Instructions (Signed)
No changes to your current BP medications made by your pharmacist Carmela Hurt, PharmD at today's visit:     Bring all of your meds, your BP cuff and your record of home blood pressures to your next appointment.    HOW TO TAKE YOUR BLOOD PRESSURE AT HOME  Rest 5 minutes before taking your blood pressure.  Don't smoke or drink caffeinated beverages for at least 30 minutes before. Take your blood pressure before (not after) you eat. Sit comfortably with your back supported and both feet on the floor (don't cross your legs). Elevate your arm to heart level on a table or a desk. Use the proper sized cuff. It should fit smoothly and snugly around your bare upper arm. There should be enough room to slip a fingertip under the cuff. The bottom edge of the cuff should be 1 inch above the crease of the elbow. Ideally, take 3 measurements at one sitting and record the average.  Important lifestyle changes to control high blood pressure  Intervention  Effect on the BP  Lose extra pounds and watch your waistline Weight loss is one of the most effective lifestyle changes for controlling blood pressure. If you're overweight or obese, losing even a small amount of weight can help reduce blood pressure. Blood pressure might go down by about 1 millimeter of mercury (mm Hg) with each kilogram (about 2.2 pounds) of weight lost.  Exercise regularly As a general goal, aim for at least 30 minutes of moderate physical activity every day. Regular physical activity can lower high blood pressure by about 5 to 8 mm Hg.  Eat a healthy diet Eating a diet rich in whole grains, fruits, vegetables, and low-fat dairy products and low in saturated fat and cholesterol. A healthy diet can lower high blood pressure by up to 11 mm Hg.  Reduce salt (sodium) in your diet Even a small reduction of sodium in the diet can improve heart health and reduce high blood pressure by about 5 to 6 mm Hg.  Limit alcohol One drink equals 12  ounces of beer, 5 ounces of wine, or 1.5 ounces of 80-proof liquor.  Limiting alcohol to less than one drink a day for women or two drinks a day for men can help lower blood pressure by about 4 mm Hg.   If you have any questions or concerns please use My Chart to send questions or call the office at [phone] or [phone]

## 2022-10-11 ENCOUNTER — Ambulatory Visit: Payer: Medicaid Other | Admitting: Internal Medicine

## 2022-10-11 ENCOUNTER — Encounter: Payer: Self-pay | Admitting: Internal Medicine

## 2022-10-11 VITALS — BP 138/78 | HR 74 | Temp 98.0°F | Resp 18 | Ht 75.0 in | Wt 253.0 lb

## 2022-10-11 DIAGNOSIS — Z6831 Body mass index (BMI) 31.0-31.9, adult: Secondary | ICD-10-CM | POA: Diagnosis not present

## 2022-10-11 DIAGNOSIS — Z5181 Encounter for therapeutic drug level monitoring: Secondary | ICD-10-CM

## 2022-10-11 DIAGNOSIS — I10 Essential (primary) hypertension: Secondary | ICD-10-CM | POA: Diagnosis not present

## 2022-10-11 DIAGNOSIS — Z131 Encounter for screening for diabetes mellitus: Secondary | ICD-10-CM | POA: Diagnosis not present

## 2022-10-11 DIAGNOSIS — G44329 Chronic post-traumatic headache, not intractable: Secondary | ICD-10-CM | POA: Diagnosis not present

## 2022-10-11 DIAGNOSIS — E291 Testicular hypofunction: Secondary | ICD-10-CM | POA: Diagnosis not present

## 2022-10-11 DIAGNOSIS — D329 Benign neoplasm of meninges, unspecified: Secondary | ICD-10-CM | POA: Diagnosis not present

## 2022-10-11 DIAGNOSIS — R569 Unspecified convulsions: Secondary | ICD-10-CM

## 2022-10-11 DIAGNOSIS — R799 Abnormal finding of blood chemistry, unspecified: Secondary | ICD-10-CM | POA: Diagnosis not present

## 2022-10-11 DIAGNOSIS — Z0000 Encounter for general adult medical examination without abnormal findings: Secondary | ICD-10-CM | POA: Insufficient documentation

## 2022-10-11 DIAGNOSIS — R519 Headache, unspecified: Secondary | ICD-10-CM | POA: Insufficient documentation

## 2022-10-11 MED ORDER — PREGABALIN 75 MG PO CAPS: 75.0000 mg | ORAL_CAPSULE | Freq: Two times a day (BID) | ORAL | 1 refills | Status: DC

## 2022-10-11 NOTE — Assessment & Plan Note (Addendum)
  He take keppra 500 twice a day. I will check Keppra level.  I will add pregabalin 75 mg twice a day for chronic headache and this may help for his seizure also.  I have advised him not to drive until he is seen neurologist.  I will refer him to see neurologist.

## 2022-10-11 NOTE — Assessment & Plan Note (Signed)
controlled 

## 2022-10-11 NOTE — Assessment & Plan Note (Signed)
S/p craniotomy and resection

## 2022-10-11 NOTE — Assessment & Plan Note (Addendum)
He get testosterone injection 200 mg once a month. He will bring testosterone injection and will give him injection here
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
## 2022-10-11 NOTE — Assessment & Plan Note (Addendum)
I will start pregabalin and he will take tylenol for pain

## 2022-10-12 ENCOUNTER — Telehealth: Payer: Self-pay | Admitting: Pharmacist

## 2022-10-12 MED ORDER — BLOOD PRESSURE MONITOR MISC: 0 refills | Status: AC

## 2022-10-12 NOTE — Telephone Encounter (Signed)
Called [facility]- they are not able to bill BP monitor to [facility] so prescription sent to [facility]. Patient informed

## 2022-10-18 DIAGNOSIS — I10 Essential (primary) hypertension: Secondary | ICD-10-CM | POA: Diagnosis not present

## 2022-10-24 ENCOUNTER — Encounter: Payer: Self-pay | Admitting: Internal Medicine

## 2022-10-24 ENCOUNTER — Ambulatory Visit: Payer: Medicaid Other | Admitting: Internal Medicine

## 2022-10-24 VITALS — BP 130/80 | HR 64 | Temp 97.9°F | Resp 18 | Ht 74.0 in | Wt 257.0 lb

## 2022-10-24 DIAGNOSIS — G44329 Chronic post-traumatic headache, not intractable: Secondary | ICD-10-CM

## 2022-10-24 DIAGNOSIS — E291 Testicular hypofunction: Secondary | ICD-10-CM | POA: Diagnosis not present

## 2022-10-24 DIAGNOSIS — R7989 Other specified abnormal findings of blood chemistry: Secondary | ICD-10-CM

## 2022-10-24 MED ORDER — TESTOSTERONE CYPIONATE 200 MG/ML IM SOLN
200.0000 mg | INTRAMUSCULAR | Status: AC
Start: 2022-10-24 — End: 2022-11-21

## 2022-10-24 NOTE — Progress Notes (Signed)
Nurse visit  Testosterone injection
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
## 2022-10-25 NOTE — Assessment & Plan Note (Signed)
Pregabalin is helping so I will continue.

## 2022-10-25 NOTE — Assessment & Plan Note (Signed)
Testosterone shot was given, I have reviewed his CBC level.

## 2022-11-21 ENCOUNTER — Ambulatory Visit: Payer: Medicaid Other | Attending: Cardiology

## 2022-11-21 ENCOUNTER — Encounter: Payer: Self-pay | Admitting: Internal Medicine

## 2022-11-21 ENCOUNTER — Ambulatory Visit: Payer: Medicaid Other | Admitting: Internal Medicine

## 2022-11-21 VITALS — BP 130/90 | HR 62 | Temp 97.8°F | Resp 18 | Ht 75.0 in | Wt 253.0 lb

## 2022-11-21 DIAGNOSIS — R569 Unspecified convulsions: Secondary | ICD-10-CM | POA: Diagnosis not present

## 2022-11-21 DIAGNOSIS — G44329 Chronic post-traumatic headache, not intractable: Secondary | ICD-10-CM

## 2022-11-21 DIAGNOSIS — M79605 Pain in left leg: Secondary | ICD-10-CM | POA: Insufficient documentation

## 2022-11-21 DIAGNOSIS — I10 Essential (primary) hypertension: Secondary | ICD-10-CM

## 2022-11-21 DIAGNOSIS — E291 Testicular hypofunction: Secondary | ICD-10-CM

## 2022-11-21 MED ORDER — PREGABALIN 100 MG PO CAPS: 100.0000 mg | ORAL_CAPSULE | Freq: Two times a day (BID) | ORAL | 2 refills | Status: DC

## 2022-11-21 MED ORDER — MELOXICAM 7.5 MG PO TABS: 7.5000 mg | ORAL_TABLET | Freq: Every day | ORAL | 2 refills | Status: DC
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
## 2022-11-22 MED ORDER — PREGABALIN 100 MG PO CAPS: 100.0000 mg | ORAL_CAPSULE | Freq: Two times a day (BID) | ORAL | 2 refills | Status: DC

## 2022-11-22 MED ORDER — MELOXICAM 7.5 MG PO TABS: 7.5000 mg | ORAL_TABLET | Freq: Every day | ORAL | 2 refills | Status: DC

## 2022-11-22 NOTE — Addendum Note (Signed)
Addended byEloisa Northern on: 11/22/2022 04:17 PM   Modules accepted: Orders

## 2022-11-22 NOTE — Assessment & Plan Note (Signed)
He will get testosterone  injection.today.

## 2022-11-22 NOTE — Assessment & Plan Note (Signed)
Better

## 2022-11-22 NOTE — Assessment & Plan Note (Signed)
No seizure activity. He will see neurologist.

## 2022-11-22 NOTE — Assessment & Plan Note (Signed)
He will take meloxicam daily for 3 weeks then as needed basis.

## 2022-11-22 NOTE — Assessment & Plan Note (Signed)
I will increase the dose of pregabalin, he will follow up neurologist.

## 2022-12-01 DIAGNOSIS — H5213 Myopia, bilateral: Secondary | ICD-10-CM | POA: Diagnosis not present

## 2022-12-07 ENCOUNTER — Other Ambulatory Visit (HOSPITAL_COMMUNITY): Payer: Self-pay | Admitting: Neurosurgery

## 2022-12-07 DIAGNOSIS — D329 Benign neoplasm of meninges, unspecified: Secondary | ICD-10-CM

## 2022-12-11 ENCOUNTER — Other Ambulatory Visit: Payer: Self-pay

## 2022-12-11 ENCOUNTER — Other Ambulatory Visit: Payer: Self-pay | Admitting: Internal Medicine

## 2022-12-11 MED ORDER — MELOXICAM 7.5 MG PO TABS: 7.5000 mg | ORAL_TABLET | Freq: Every day | ORAL | 2 refills | Status: DC

## 2022-12-12 ENCOUNTER — Other Ambulatory Visit: Payer: Self-pay | Admitting: Internal Medicine

## 2022-12-12 MED ORDER — PREGABALIN 100 MG PO CAPS: 100.0000 mg | ORAL_CAPSULE | Freq: Two times a day (BID) | ORAL | 2 refills | Status: DC

## 2022-12-18 ENCOUNTER — Other Ambulatory Visit: Payer: Self-pay | Admitting: Internal Medicine

## 2022-12-19 ENCOUNTER — Ambulatory Visit: Payer: Medicaid Other | Admitting: Internal Medicine

## 2022-12-19 ENCOUNTER — Encounter: Payer: Self-pay | Admitting: Internal Medicine

## 2022-12-19 VITALS — BP 130/80 | HR 73 | Temp 98.1°F | Resp 20 | Ht 75.0 in | Wt 261.5 lb

## 2022-12-19 DIAGNOSIS — E876 Hypokalemia: Secondary | ICD-10-CM | POA: Diagnosis not present

## 2022-12-19 DIAGNOSIS — Z9889 Other specified postprocedural states: Secondary | ICD-10-CM

## 2022-12-19 DIAGNOSIS — G44329 Chronic post-traumatic headache, not intractable: Secondary | ICD-10-CM | POA: Diagnosis not present

## 2022-12-19 DIAGNOSIS — R569 Unspecified convulsions: Secondary | ICD-10-CM | POA: Diagnosis not present

## 2022-12-19 DIAGNOSIS — E291 Testicular hypofunction: Secondary | ICD-10-CM

## 2022-12-19 DIAGNOSIS — I10 Essential (primary) hypertension: Secondary | ICD-10-CM

## 2022-12-19 DIAGNOSIS — D329 Benign neoplasm of meninges, unspecified: Secondary | ICD-10-CM | POA: Diagnosis not present

## 2022-12-19 MED ORDER — TESTOSTERONE CYPIONATE 100 MG/ML IM SOLN
100.0000 mg | INTRAMUSCULAR | 3 refills | Status: AC
Start: 2022-12-19 — End: ?
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
## 2022-12-19 NOTE — Addendum Note (Signed)
Addended byRosalie Doctor on: 12/19/2022 12:00 PM   Modules accepted: Orders

## 2022-12-19 NOTE — Assessment & Plan Note (Signed)
S/p craniostomy and his neurosurgeon wanted him to have repeat MRI brain with and without contrast.

## 2022-12-20 LAB — CMP14 + ANION GAP
ALT: 28 [IU]/L (ref 0–44)
AST: 24 [IU]/L (ref 0–40)
Albumin: 4.5 g/dL (ref 3.8–4.9)
Alkaline Phosphatase: 67 [IU]/L (ref 44–121)
Anion Gap: 16 mmol/L (ref 10.0–18.0)
BUN/Creatinine Ratio: 9 (ref 9–20)
BUN: 7 mg/dL (ref 6–24)
Bilirubin Total: 1 mg/dL (ref 0.0–1.2)
CO2: 24 mmol/L (ref 20–29)
Calcium: 9.4 mg/dL (ref 8.7–10.2)
Chloride: 98 mmol/L (ref 96–106)
Creatinine, Ser: 0.76 mg/dL (ref 0.76–1.27)
Globulin, Total: 2.9 g/dL (ref 1.5–4.5)
Glucose: 109 mg/dL — ABNORMAL HIGH (ref 70–99)
Potassium: 3.1 mmol/L — ABNORMAL LOW (ref 3.5–5.2)
Sodium: 138 mmol/L (ref 134–144)
Total Protein: 7.4 g/dL (ref 6.0–8.5)
eGFR: 104 mL/min/{1.73_m2} (ref >59)

## 2022-12-20 LAB — LEVETIRACETAM LEVEL: Levetiracetam Lvl: <2 ug/mL — ABNORMAL LOW (ref 10.0–40.0)

## 2022-12-22 ENCOUNTER — Ambulatory Visit: Payer: Medicaid Other | Admitting: Internal Medicine

## 2022-12-22 ENCOUNTER — Other Ambulatory Visit: Payer: Self-pay

## 2022-12-22 DIAGNOSIS — R7989 Other specified abnormal findings of blood chemistry: Secondary | ICD-10-CM

## 2022-12-22 DIAGNOSIS — E291 Testicular hypofunction: Secondary | ICD-10-CM | POA: Diagnosis not present

## 2022-12-22 MED ORDER — LEVETIRACETAM 500 MG PO TABS: 500.0000 mg | ORAL_TABLET | Freq: Two times a day (BID) | ORAL | 3 refills | Status: DC

## 2022-12-22 MED ORDER — TESTOSTERONE CYPIONATE 200 MG/ML IM SOLN
200.0000 mg | INTRAMUSCULAR | Status: AC
Start: 2022-12-22 — End: ?
  Administered 2023-02-15 – 2023-06-12 (×6): 200 mg via INTRAMUSCULAR

## 2022-12-22 MED ORDER — METOPROLOL SUCCINATE ER 100 MG PO TB24: 100.0000 mg | ORAL_TABLET | Freq: Every day | ORAL | 2 refills | Status: DC

## 2022-12-22 NOTE — Progress Notes (Signed)
Refill medication

## 2022-12-22 NOTE — Progress Notes (Signed)
Nurse visit

## 2023-01-02 ENCOUNTER — Ambulatory Visit: Payer: Medicaid Other | Admitting: Neurology

## 2023-01-02 ENCOUNTER — Encounter: Payer: Self-pay | Admitting: Neurology

## 2023-01-02 VITALS — BP 139/79 | HR 98 | Ht 74.0 in | Wt 261.5 lb

## 2023-01-02 DIAGNOSIS — D329 Benign neoplasm of meninges, unspecified: Secondary | ICD-10-CM

## 2023-01-02 DIAGNOSIS — G40909 Epilepsy, unspecified, not intractable, without status epilepticus: Secondary | ICD-10-CM

## 2023-01-02 DIAGNOSIS — Z5181 Encounter for therapeutic drug level monitoring: Secondary | ICD-10-CM

## 2023-01-02 MED ORDER — LEVETIRACETAM 1000 MG PO TABS: 1000.0000 mg | ORAL_TABLET | Freq: Two times a day (BID) | ORAL | 0 refills | Status: DC
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
## 2023-01-02 NOTE — Patient Instructions (Addendum)
Increase Keppra to 1000 mg twice daily  MRI Brain w wo contrast, follow up meningioma resection  Routine EEG  Will check a Keppra level today Follow up in 6 months or sooner if worse

## 2023-01-03 ENCOUNTER — Telehealth: Payer: Self-pay | Admitting: Neurology

## 2023-01-03 LAB — LEVETIRACETAM LEVEL: Levetiracetam Lvl: 4.9 ug/mL — ABNORMAL LOW (ref 10.0–40.0)

## 2023-01-03 NOTE — Telephone Encounter (Signed)
[employer] medicaid Berkley Harvey: [id number] exp. 01/03/23-02/17/23 sent to GI [phone]

## 2023-01-03 NOTE — Progress Notes (Signed)
Please call and advise the patient that the recent Keppra level was low. Please advise patient to take the Keppra as directed, 1000 mg twice daily.  Please remind patient to keep any upcoming appointments or tests and to call us with any interim questions, concerns, problems or updates. Thanks,   Windell Norfolk, MD

## 2023-01-08 ENCOUNTER — Encounter: Payer: Self-pay | Admitting: *Deleted

## 2023-01-09 ENCOUNTER — Ambulatory Visit: Payer: Medicaid Other

## 2023-01-11 ENCOUNTER — Telehealth: Payer: Self-pay

## 2023-01-11 ENCOUNTER — Ambulatory Visit: Payer: Medicaid Other | Admitting: Neurology

## 2023-01-11 ENCOUNTER — Telehealth: Payer: Self-pay | Admitting: Neurology

## 2023-01-11 DIAGNOSIS — G40909 Epilepsy, unspecified, not intractable, without status epilepticus: Secondary | ICD-10-CM | POA: Diagnosis not present

## 2023-01-11 DIAGNOSIS — Z5181 Encounter for therapeutic drug level monitoring: Secondary | ICD-10-CM

## 2023-01-11 NOTE — Telephone Encounter (Signed)
Patient returned call but was unable to wait for a nurse to become available. Requested a call back at [phone].

## 2023-01-11 NOTE — Telephone Encounter (Signed)
-----   Message from [facility] sent at 01/11/2023  1:11 PM EST ----- Please call and inform patient that his EEG (Brain wave test) was normal. In particular, there were no epileptiform discharges and no seizures. No further action is required on this test at this time. Continue with Keppra 1000 mg twice daily. Please keep any upcoming appointments or tests and  call us with any interim questions, concerns, problems or updates. Thanks,   Windell Norfolk, MD

## 2023-01-11 NOTE — Telephone Encounter (Signed)
Being addressed in another encounter

## 2023-01-11 NOTE — Progress Notes (Signed)
Please call and inform patient that his EEG (Brain wave test) was normal. In particular, there were no epileptiform discharges and no seizures. No further action is required on this test at this time. Continue with Keppra 1000 mg twice daily. Please keep any upcoming appointments or tests and  call us with any interim questions, concerns, problems or updates. Thanks,   Windell Norfolk, MD

## 2023-01-11 NOTE — Telephone Encounter (Signed)
Left msg to return call

## 2023-01-11 NOTE — Procedures (Signed)
   History:  57 year old man with seizure disorder   EEG classification:  Awake and asleep  Duration: 26 minutes   Technical aspects: This EEG study was done with scalp electrodes positioned according to the 10-20 International system of electrode placement. Electrical activity was reviewed with band pass filter of 1-70Hz , sensitivity of 7 uV/mm, display speed of 42mm/sec with a 60Hz  notched filter applied as appropriate. EEG data were recorded continuously and digitally stored.   Description of the recording: The background rhythms of this recording consists of a fairly well modulated medium amplitude background activity of 10 Hz. As the record progresses, the patient initially is in the waking state, but appears to enter the early stage II sleep during the recording, with rudimentary sleep spindles and vertex sharp wave activity seen. During the wakeful state, photic stimulation was performed, and no abnormal responses were seen. Hyperventilation was also performed, no abnormal response seen. No epileptiform discharges seen during this recording. There was no focal slowing.   Abnormality: None   Impression: This is a normal EEG recording in the waking and sleeping state. No evidence of interictal epileptiform discharges. Normal EEGs, however, do not rule out epilepsy.    Windell Norfolk, MD [facility]

## 2023-01-11 NOTE — Telephone Encounter (Signed)
2nd attempt left msg to return call

## 2023-01-11 NOTE — Telephone Encounter (Signed)
Patient called.  Patient aware.  

## 2023-01-23 ENCOUNTER — Ambulatory Visit: Payer: Medicaid Other

## 2023-02-15 ENCOUNTER — Ambulatory Visit: Payer: Medicaid Other | Admitting: Internal Medicine

## 2023-02-15 DIAGNOSIS — I10 Essential (primary) hypertension: Secondary | ICD-10-CM

## 2023-02-26 ENCOUNTER — Ambulatory Visit
Admission: RE | Admit: 2023-02-26 | Discharge: 2023-02-26 | Disposition: A | Discharge status: Home / Self Care | Payer: Medicaid Other | Source: Ambulatory Visit | Attending: Neurology

## 2023-02-26 DIAGNOSIS — G40909 Epilepsy, unspecified, not intractable, without status epilepticus: Secondary | ICD-10-CM | POA: Diagnosis not present

## 2023-02-26 DIAGNOSIS — D329 Benign neoplasm of meninges, unspecified: Secondary | ICD-10-CM | POA: Diagnosis not present

## 2023-02-26 MED ORDER — GADOPICLENOL 0.5 MMOL/ML IV SOLN
10.0000 mL | Freq: Once | INTRAVENOUS | Status: AC | PRN
  Administered 2023-02-26: 10 mL via INTRAVENOUS

## 2023-03-05 ENCOUNTER — Other Ambulatory Visit: Payer: Self-pay | Admitting: Neurology

## 2023-03-05 DIAGNOSIS — D329 Benign neoplasm of meninges, unspecified: Secondary | ICD-10-CM

## 2023-03-05 NOTE — Telephone Encounter (Signed)
-----   Message from Andrew Lawrence sent at 03/05/2023  4:29 PM EST ----- Please let the patient know that the residual meningioma has very slightly increased in size compared to his MRI from April.  He should have another MRI in 9-12 months

## 2023-03-05 NOTE — Telephone Encounter (Signed)
This encounter was created in error - please disregard.

## 2023-03-06 ENCOUNTER — Ambulatory Visit: Payer: Medicaid Other | Admitting: Internal Medicine

## 2023-03-06 VITALS — BP 126/80 | HR 61 | Temp 98.0°F | Resp 18 | Ht 74.0 in | Wt 259.0 lb

## 2023-03-06 DIAGNOSIS — R569 Unspecified convulsions: Secondary | ICD-10-CM | POA: Diagnosis not present

## 2023-03-06 DIAGNOSIS — D329 Benign neoplasm of meninges, unspecified: Secondary | ICD-10-CM | POA: Diagnosis not present

## 2023-03-06 DIAGNOSIS — E291 Testicular hypofunction: Secondary | ICD-10-CM

## 2023-03-06 DIAGNOSIS — I10 Essential (primary) hypertension: Secondary | ICD-10-CM | POA: Diagnosis not present

## 2023-03-06 DIAGNOSIS — K5901 Slow transit constipation: Secondary | ICD-10-CM | POA: Diagnosis not present

## 2023-03-06 MED ORDER — POLYETHYLENE GLYCOL 3350 17 G PO PACK: 17.0000 g | PACK | Freq: Every day | ORAL | 0 refills | Status: AC

## 2023-03-06 NOTE — Assessment & Plan Note (Signed)
He will take miralex 17 gram in a glass of water daily. He will also avoid food that cause more gas.
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
## 2023-03-06 NOTE — Assessment & Plan Note (Signed)
He got testosterone shot today. Will do CBC on next visit.

## 2023-03-08 ENCOUNTER — Other Ambulatory Visit: Payer: Self-pay | Admitting: Internal Medicine

## 2023-03-12 ENCOUNTER — Other Ambulatory Visit: Payer: Self-pay

## 2023-03-13 ENCOUNTER — Other Ambulatory Visit: Payer: Self-pay | Admitting: Internal Medicine

## 2023-03-13 MED ORDER — PREGABALIN 100 MG PO CAPS: 100.0000 mg | ORAL_CAPSULE | Freq: Two times a day (BID) | ORAL | 2 refills | Status: DC

## 2023-03-22 ENCOUNTER — Other Ambulatory Visit: Payer: Self-pay

## 2023-03-22 ENCOUNTER — Ambulatory Visit: Payer: Medicaid Other | Admitting: Internal Medicine

## 2023-03-22 DIAGNOSIS — E291 Testicular hypofunction: Secondary | ICD-10-CM

## 2023-03-22 MED ORDER — METOPROLOL SUCCINATE ER 100 MG PO TB24: 100.0000 mg | ORAL_TABLET | Freq: Every day | ORAL | 2 refills | Status: DC

## 2023-04-01 ENCOUNTER — Other Ambulatory Visit: Payer: Self-pay | Admitting: Neurology

## 2023-04-02 NOTE — Telephone Encounter (Signed)
Rx refilled per telephone note from 01/11/2023, "Continue with Keppra 1000 mg twice daily"

## 2023-04-10 ENCOUNTER — Ambulatory Visit: Payer: Medicaid Other

## 2023-04-19 ENCOUNTER — Ambulatory Visit: Payer: Medicaid Other | Admitting: Internal Medicine

## 2023-04-19 DIAGNOSIS — E291 Testicular hypofunction: Secondary | ICD-10-CM

## 2023-05-01 ENCOUNTER — Other Ambulatory Visit: Payer: Self-pay | Admitting: Internal Medicine

## 2023-05-03 ENCOUNTER — Ambulatory Visit: Payer: Medicaid Other | Admitting: Internal Medicine

## 2023-05-03 DIAGNOSIS — I10 Essential (primary) hypertension: Secondary | ICD-10-CM

## 2023-05-17 ENCOUNTER — Ambulatory Visit: Payer: Medicaid Other

## 2023-05-29 ENCOUNTER — Ambulatory Visit: Payer: Medicaid Other | Admitting: Internal Medicine

## 2023-05-29 ENCOUNTER — Encounter: Payer: Self-pay | Admitting: Internal Medicine

## 2023-05-29 VITALS — BP 124/80 | HR 55 | Temp 97.9°F | Resp 18 | Ht 74.0 in | Wt 261.5 lb

## 2023-05-29 DIAGNOSIS — E291 Testicular hypofunction: Secondary | ICD-10-CM

## 2023-05-29 DIAGNOSIS — I10 Essential (primary) hypertension: Secondary | ICD-10-CM | POA: Diagnosis not present

## 2023-05-29 DIAGNOSIS — R7303 Prediabetes: Secondary | ICD-10-CM | POA: Insufficient documentation

## 2023-05-29 DIAGNOSIS — E876 Hypokalemia: Secondary | ICD-10-CM

## 2023-05-29 DIAGNOSIS — D329 Benign neoplasm of meninges, unspecified: Secondary | ICD-10-CM | POA: Diagnosis not present

## 2023-05-29 DIAGNOSIS — G44329 Chronic post-traumatic headache, not intractable: Secondary | ICD-10-CM | POA: Diagnosis not present

## 2023-05-29 MED ORDER — PREGABALIN 75 MG PO CAPS: 75.0000 mg | ORAL_CAPSULE | Freq: Two times a day (BID) | ORAL | 2 refills | Status: DC

## 2023-05-29 MED ORDER — POTASSIUM CHLORIDE ER 10 MEQ PO TBCR: 10.0000 meq | EXTENDED_RELEASE_TABLET | Freq: Every day | ORAL | 6 refills | Status: DC

## 2023-05-29 NOTE — Assessment & Plan Note (Signed)
 He does not see much difference with 100 mg testosterone injection. I will do testosterone level and CBC next week as he will come fasting.

## 2023-05-29 NOTE — Assessment & Plan Note (Signed)
 His potassium level has been low and I will restart him on potassium chloride 10 meq daily

## 2023-05-29 NOTE — Assessment & Plan Note (Signed)
His blood pressure is controlled.
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
## 2023-06-07 ENCOUNTER — Ambulatory Visit

## 2023-06-11 ENCOUNTER — Other Ambulatory Visit: Payer: Self-pay | Admitting: Internal Medicine

## 2023-06-12 ENCOUNTER — Ambulatory Visit: Admitting: Internal Medicine

## 2023-06-12 DIAGNOSIS — E291 Testicular hypofunction: Secondary | ICD-10-CM | POA: Diagnosis not present

## 2023-06-12 DIAGNOSIS — R7303 Prediabetes: Secondary | ICD-10-CM | POA: Diagnosis not present

## 2023-06-12 DIAGNOSIS — R7989 Other specified abnormal findings of blood chemistry: Secondary | ICD-10-CM

## 2023-06-12 NOTE — Progress Notes (Signed)
 Nurse visit Blood draw  Testosterone injection

## 2023-06-13 ENCOUNTER — Other Ambulatory Visit: Payer: Self-pay | Admitting: Internal Medicine

## 2023-06-13 LAB — CMP14 + ANION GAP
ALT: 28 IU/L (ref 0–44)
AST: 22 IU/L (ref 0–40)
Albumin: 4.8 g/dL (ref 3.8–4.9)
Alkaline Phosphatase: 70 IU/L (ref 44–121)
Anion Gap: 16 mmol/L (ref 10.0–18.0)
BUN/Creatinine Ratio: 10 (ref 10–24)
BUN: 9 mg/dL (ref 8–27)
Bilirubin Total: 1.2 mg/dL (ref 0.0–1.2)
CO2: 26 mmol/L (ref 20–29)
Calcium: 9.5 mg/dL (ref 8.6–10.2)
Chloride: 98 mmol/L (ref 96–106)
Creatinine, Ser: 0.87 mg/dL (ref 0.76–1.27)
Globulin, Total: 3.2 g/dL (ref 1.5–4.5)
Glucose: 88 mg/dL (ref 70–99)
Potassium: 3.4 mmol/L — ABNORMAL LOW (ref 3.5–5.2)
Sodium: 140 mmol/L (ref 134–144)
Total Protein: 8 g/dL (ref 6.0–8.5)
eGFR: 99 mL/min/{1.73_m2} (ref >59)

## 2023-06-13 LAB — CBC WITH DIFFERENTIAL/PLATELET
Basophils Absolute: 0 10*3/uL (ref 0.0–0.2)
Basos: 1 %
EOS (ABSOLUTE): 0.3 10*3/uL (ref 0.0–0.4)
Eos: 4 %
Hematocrit: 42.5 % (ref 37.5–51.0)
Hemoglobin: 13.9 g/dL (ref 13.0–17.7)
Immature Grans (Abs): 0 10*3/uL (ref 0.0–0.1)
Immature Granulocytes: 0 %
Lymphocytes Absolute: 2.9 10*3/uL (ref 0.7–3.1)
Lymphs: 49 %
MCH: 27.7 pg (ref 26.6–33.0)
MCHC: 32.7 g/dL (ref 31.5–35.7)
MCV: 85 fL (ref 79–97)
Monocytes Absolute: 0.5 10*3/uL (ref 0.1–0.9)
Monocytes: 8 %
Neutrophils Absolute: 2.2 10*3/uL (ref 1.4–7.0)
Neutrophils: 38 %
Platelets: 236 10*3/uL (ref 150–450)
RBC: 5.01 x10E6/uL (ref 4.14–5.80)
RDW: 14.7 % (ref 11.6–15.4)
WBC: 5.8 10*3/uL (ref 3.4–10.8)

## 2023-06-13 MED ORDER — POTASSIUM CHLORIDE ER 10 MEQ PO TBCR: 20.0000 meq | EXTENDED_RELEASE_TABLET | Freq: Every day | ORAL | 6 refills | Status: DC

## 2023-06-29 ENCOUNTER — Other Ambulatory Visit: Payer: Self-pay | Admitting: Neurology

## 2023-06-30 ENCOUNTER — Other Ambulatory Visit: Payer: Self-pay | Admitting: Internal Medicine

## 2023-07-09 ENCOUNTER — Telehealth: Payer: Self-pay | Admitting: Anesthesiology

## 2023-07-09 NOTE — Telephone Encounter (Signed)
 Pt returned call regarding his appointment with Dr Samara Crest. Pt's appointment was scheduled for 07/11/2023 at 11:15 am. Pt was advised to be here 30 min prior to check in and fill out paperwork if needed. Pt verbalized understanding.

## 2023-07-10 ENCOUNTER — Telehealth: Payer: Self-pay | Admitting: Neurology

## 2023-07-10 ENCOUNTER — Ambulatory Visit: Payer: Medicaid Other | Admitting: Neurology

## 2023-07-10 NOTE — Telephone Encounter (Signed)
 LVM and sent mychart msg informing pt of need to reschedule 07/11/23 appt - MD out

## 2023-07-10 NOTE — Telephone Encounter (Signed)
 Pt r/s appointment due to provider not being available

## 2023-07-11 ENCOUNTER — Ambulatory Visit: Admitting: Neurology

## 2023-07-12 ENCOUNTER — Ambulatory Visit

## 2023-07-19 ENCOUNTER — Other Ambulatory Visit: Payer: Self-pay | Admitting: Internal Medicine

## 2023-08-06 ENCOUNTER — Ambulatory Visit: Admitting: Neurology

## 2023-08-06 ENCOUNTER — Ambulatory Visit: Payer: Medicaid Other | Admitting: Neurology

## 2023-08-08 ENCOUNTER — Ambulatory Visit: Admitting: Internal Medicine

## 2023-08-08 ENCOUNTER — Encounter: Payer: Self-pay | Admitting: Neurology

## 2023-08-08 ENCOUNTER — Telehealth: Payer: Self-pay | Admitting: Neurology

## 2023-08-08 ENCOUNTER — Encounter: Payer: Self-pay | Admitting: Internal Medicine

## 2023-08-08 ENCOUNTER — Ambulatory Visit: Admitting: Neurology

## 2023-08-08 VITALS — BP 138/88 | Ht 75.0 in | Wt 259.5 lb

## 2023-08-08 VITALS — BP 132/82 | HR 68 | Temp 97.8°F | Resp 18 | Ht 74.0 in | Wt 262.0 lb

## 2023-08-08 DIAGNOSIS — G44329 Chronic post-traumatic headache, not intractable: Secondary | ICD-10-CM | POA: Diagnosis not present

## 2023-08-08 DIAGNOSIS — I10 Essential (primary) hypertension: Secondary | ICD-10-CM

## 2023-08-08 DIAGNOSIS — D329 Benign neoplasm of meninges, unspecified: Secondary | ICD-10-CM

## 2023-08-08 DIAGNOSIS — R7303 Prediabetes: Secondary | ICD-10-CM | POA: Diagnosis not present

## 2023-08-08 DIAGNOSIS — R569 Unspecified convulsions: Secondary | ICD-10-CM | POA: Diagnosis not present

## 2023-08-08 DIAGNOSIS — E291 Testicular hypofunction: Secondary | ICD-10-CM

## 2023-08-08 DIAGNOSIS — G40109 Localization-related (focal) (partial) symptomatic epilepsy and epileptic syndromes with simple partial seizures, not intractable, without status epilepticus: Secondary | ICD-10-CM | POA: Diagnosis not present

## 2023-08-08 MED ORDER — SILDENAFIL CITRATE 100 MG PO TABS: 100.0000 mg | ORAL_TABLET | Freq: Every day | ORAL | 0 refills | Status: DC | PRN

## 2023-08-08 MED ORDER — LEVETIRACETAM 1000 MG PO TABS: 1000.0000 mg | ORAL_TABLET | Freq: Two times a day (BID) | ORAL | 3 refills | Status: DC

## 2023-08-08 MED ORDER — PREGABALIN 75 MG PO CAPS: 75.0000 mg | ORAL_CAPSULE | Freq: Two times a day (BID) | ORAL | 5 refills | Status: AC

## 2023-08-08 MED ORDER — DULOXETINE HCL 30 MG PO CPEP: 30.0000 mg | ORAL_CAPSULE | Freq: Every day | ORAL | 2 refills | Status: AC

## 2023-08-08 NOTE — Assessment & Plan Note (Signed)
 He will continue with testosterone  injection and I will send sildenafil as needed
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
## 2023-08-08 NOTE — Assessment & Plan Note (Signed)
 controlled

## 2023-08-08 NOTE — Assessment & Plan Note (Signed)
He will watch his diet

## 2023-08-08 NOTE — Telephone Encounter (Signed)
 Dr. Samara Crest asked that the MRI that Dr. Godwin Lat ordered for September be done in June instead. [employer] medicaid [place] Dress: [id number] exp. 08/08/23-09/22/23 sent to GI [phone]

## 2023-08-08 NOTE — Assessment & Plan Note (Signed)
 He will continue with keppra 

## 2023-08-08 NOTE — Patient Instructions (Addendum)
 Continue with Keppra  1000 mg twice daily, will check Keppra  level today Will repeat MRI brain with and without contrast I will give him Duloxetine to help with his surgical site pain Follow-up in 6 months or sooner if worse. Please contact me if you have any additional question or concerns Continue to follow with PCP.
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
## 2023-08-08 NOTE — Assessment & Plan Note (Signed)
 He takes pregabalyn 75 mg twice a day

## 2023-08-13 NOTE — Progress Notes (Signed)
Test inj

## 2023-08-13 NOTE — Progress Notes (Unsigned)
 Lab draw

## 2023-08-13 NOTE — Progress Notes (Unsigned)
 Nurse visit

## 2023-08-23 NOTE — Progress Notes (Signed)
 Inj test

## 2023-08-28 ENCOUNTER — Ambulatory Visit: Admitting: Internal Medicine

## 2023-09-10 ENCOUNTER — Other Ambulatory Visit: Payer: Self-pay | Admitting: Internal Medicine

## 2023-09-14 ENCOUNTER — Other Ambulatory Visit

## 2023-09-14 ENCOUNTER — Ambulatory Visit
Admission: RE | Admit: 2023-09-14 | Discharge: 2023-09-14 | Disposition: A | Discharge status: Home / Self Care | Source: Ambulatory Visit | Attending: Neurology | Admitting: Neurology

## 2023-09-14 DIAGNOSIS — D329 Benign neoplasm of meninges, unspecified: Secondary | ICD-10-CM | POA: Diagnosis not present

## 2023-09-14 MED ORDER — GADOPICLENOL 0.5 MMOL/ML IV SOLN
10.0000 mL | Freq: Once | INTRAVENOUS | Status: AC | PRN
  Administered 2023-09-14: 10 mL via INTRAVENOUS

## 2023-09-21 ENCOUNTER — Ambulatory Visit: Payer: Self-pay | Admitting: Neurology

## 2023-09-21 ENCOUNTER — Encounter: Payer: Self-pay | Admitting: Internal Medicine

## 2023-09-21 ENCOUNTER — Ambulatory Visit: Admitting: Internal Medicine

## 2023-09-21 VITALS — BP 124/78 | HR 65 | Temp 97.7°F | Resp 18 | Ht 74.0 in | Wt 257.4 lb

## 2023-09-21 DIAGNOSIS — R7303 Prediabetes: Secondary | ICD-10-CM | POA: Diagnosis not present

## 2023-09-21 DIAGNOSIS — K5901 Slow transit constipation: Secondary | ICD-10-CM | POA: Diagnosis not present

## 2023-09-21 DIAGNOSIS — E291 Testicular hypofunction: Secondary | ICD-10-CM | POA: Diagnosis not present

## 2023-09-21 DIAGNOSIS — I10 Essential (primary) hypertension: Secondary | ICD-10-CM

## 2023-09-21 DIAGNOSIS — R569 Unspecified convulsions: Secondary | ICD-10-CM | POA: Diagnosis not present

## 2023-09-21 DIAGNOSIS — G44329 Chronic post-traumatic headache, not intractable: Secondary | ICD-10-CM | POA: Diagnosis not present

## 2023-09-21 DIAGNOSIS — Z13220 Encounter for screening for lipoid disorders: Secondary | ICD-10-CM | POA: Diagnosis not present

## 2023-09-21 DIAGNOSIS — Z6833 Body mass index (BMI) 33.0-33.9, adult: Secondary | ICD-10-CM

## 2023-09-21 DIAGNOSIS — Z1321 Encounter for screening for nutritional disorder: Secondary | ICD-10-CM | POA: Diagnosis not present

## 2023-09-21 DIAGNOSIS — D329 Benign neoplasm of meninges, unspecified: Secondary | ICD-10-CM

## 2023-09-21 DIAGNOSIS — Z125 Encounter for screening for malignant neoplasm of prostate: Secondary | ICD-10-CM | POA: Diagnosis not present

## 2023-09-21 DIAGNOSIS — Z9889 Other specified postprocedural states: Secondary | ICD-10-CM

## 2023-09-21 DIAGNOSIS — Z131 Encounter for screening for diabetes mellitus: Secondary | ICD-10-CM | POA: Diagnosis not present

## 2023-09-21 DIAGNOSIS — Z0000 Encounter for general adult medical examination without abnormal findings: Secondary | ICD-10-CM

## 2023-09-21 NOTE — Telephone Encounter (Signed)
 Pt returned call. Please call back when available.
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
## 2023-09-21 NOTE — Progress Notes (Signed)
 Please call and inform patient that the recent MRI brain showed residual meningioma and that we will have to repeat the MRI in 1 year. I did leave him a voice message on 09/21/2023 at 1040 AM.   Dr. Gregg

## 2023-09-22 LAB — PSA: Prostate Specific Ag, Serum: 0.3 ng/mL (ref 0.0–4.0)

## 2023-09-22 LAB — CBC WITH DIFFERENTIAL/PLATELET
Basophils Absolute: 0.1 x10E3/uL (ref 0.0–0.2)
Basos: 1 %
EOS (ABSOLUTE): 0.3 x10E3/uL (ref 0.0–0.4)
Eos: 4 %
Hematocrit: 41.5 % (ref 37.5–51.0)
Hemoglobin: 13.6 g/dL (ref 13.0–17.7)
Immature Grans (Abs): 0 x10E3/uL (ref 0.0–0.1)
Immature Granulocytes: 0 %
Lymphocytes Absolute: 2.7 x10E3/uL (ref 0.7–3.1)
Lymphs: 44 %
MCH: 28.4 pg (ref 26.6–33.0)
MCHC: 32.8 g/dL (ref 31.5–35.7)
MCV: 87 fL (ref 79–97)
Monocytes Absolute: 0.4 x10E3/uL (ref 0.1–0.9)
Monocytes: 7 %
Neutrophils Absolute: 2.7 x10E3/uL (ref 1.4–7.0)
Neutrophils: 44 %
Platelets: 200 x10E3/uL (ref 150–450)
RBC: 4.79 x10E6/uL (ref 4.14–5.80)
RDW: 14.4 % (ref 11.6–15.4)
WBC: 6.1 x10E3/uL (ref 3.4–10.8)

## 2023-09-22 LAB — CMP14 + ANION GAP
ALT: 34 IU/L (ref 0–44)
AST: 23 IU/L (ref 0–40)
Albumin: 4.3 g/dL (ref 3.8–4.9)
Alkaline Phosphatase: 73 IU/L (ref 44–121)
Anion Gap: 16 mmol/L (ref 10.0–18.0)
BUN/Creatinine Ratio: 11 (ref 10–24)
BUN: 11 mg/dL (ref 8–27)
Bilirubin Total: 0.8 mg/dL (ref 0.0–1.2)
CO2: 23 mmol/L (ref 20–29)
Calcium: 9.4 mg/dL (ref 8.6–10.2)
Chloride: 100 mmol/L (ref 96–106)
Creatinine, Ser: 1.03 mg/dL (ref 0.76–1.27)
Globulin, Total: 2.8 g/dL (ref 1.5–4.5)
Glucose: 183 mg/dL — ABNORMAL HIGH (ref 70–99)
Potassium: 3.2 mmol/L — ABNORMAL LOW (ref 3.5–5.2)
Sodium: 139 mmol/L (ref 134–144)
Total Protein: 7.1 g/dL (ref 6.0–8.5)
eGFR: 83 mL/min/1.73 (ref >59)

## 2023-09-22 LAB — TSH: TSH: 3.53 u[IU]/mL (ref 0.450–4.500)

## 2023-09-22 LAB — LIPID PANEL
Chol/HDL Ratio: 3.3 ratio (ref 0.0–5.0)
Cholesterol, Total: 156 mg/dL (ref 100–199)
HDL: 47 mg/dL (ref >39)
LDL Chol Calc (NIH): 87 mg/dL (ref 0–99)
Triglycerides: 123 mg/dL (ref 0–149)
VLDL Cholesterol Cal: 22 mg/dL (ref 5–40)

## 2023-09-22 LAB — HEMOGLOBIN A1C
Est. average glucose Bld gHb Est-mCnc: 126 mg/dL
Hgb A1c MFr Bld: 6 % — ABNORMAL HIGH (ref 4.8–5.6)

## 2023-09-22 LAB — VITAMIN D 25 HYDROXY (VIT D DEFICIENCY, FRACTURES): Vit D, 25-Hydroxy: 18.9 ng/mL — ABNORMAL LOW (ref 30.0–100.0)

## 2023-09-25 ENCOUNTER — Ambulatory Visit: Payer: Self-pay

## 2023-09-25 NOTE — Progress Notes (Signed)
Patient called.  Left message for patient to call back.

## 2023-09-26 ENCOUNTER — Telehealth: Payer: Self-pay | Admitting: Neurology

## 2023-09-26 MED ORDER — LEVETIRACETAM 1000 MG PO TABS: 1000.0000 mg | ORAL_TABLET | Freq: Two times a day (BID) | ORAL | 3 refills | Status: AC

## 2023-09-26 NOTE — Telephone Encounter (Signed)
 REFILL SENT

## 2023-09-26 NOTE — Progress Notes (Signed)
 Patient called.  Patient aware. Please tell him that his labs are good except potassium is low. Does he take potassium tablet 10 meq daily ? If he does then he need to increase it to 20 meq daily. I will send new precription for him

## 2023-09-26 NOTE — Telephone Encounter (Signed)
 Pt called and was informed of the MRI results. I also informed him to continue to get his MRI's done every 9-12 months. Pt verbalized understanding and was very appreciative. Pt also stated that he is needing a refill on his levETIRAcetam  (KEPPRA ) 1000 MG tablet sent in to the [facility] on [address]

## 2023-09-30 NOTE — Assessment & Plan Note (Signed)
 He denies constipation but a constant feeling of urge to defecate particularly when standing and feed like that he will have accident post craniotomy.  I will refer him to use see gastroenterologist for evaluation as well as screening for colonoscopy.

## 2023-09-30 NOTE — Assessment & Plan Note (Signed)
 His blood pressure is well controlled on current medication.  As his potassium level was low so our repeat CMP.

## 2023-09-30 NOTE — Assessment & Plan Note (Signed)
 He has borderline diabetes.  I will repeat hemoglobin A1c today.

## 2023-09-30 NOTE — Assessment & Plan Note (Signed)
 I will do routine labs.  I will refer him to see gastroenterologist for screening colonoscopy and constant feeling of urge to defecate particularly when standing.

## 2023-09-30 NOTE — Addendum Note (Signed)
 Addended by: Wendee Hata on: 09/30/2023 07:40 PM   Modules accepted: Orders

## 2023-09-30 NOTE — Assessment & Plan Note (Signed)
 Status post meningioma resection.  He still has a pain on left side head

## 2023-09-30 NOTE — Assessment & Plan Note (Signed)
 Post craniotomy.  I have started him on pregabalin  that has helped some.  I could not increase the does because of dizziness.

## 2023-09-30 NOTE — Assessment & Plan Note (Signed)
 Status post meningioma resection April last year.  He is on Keppra  a 1000 mg twice a day for seizure prevention.  He has a recurrence of disease and is getting every 6 months repeat MRI and is being followed by neurologist.

## 2023-09-30 NOTE — Assessment & Plan Note (Addendum)
 No seizure noted within last 1 year.  His EEG was negative.

## 2023-09-30 NOTE — Assessment & Plan Note (Signed)
 He has been getting testosterone  injection 200 mg q.2 weeks.  He still has erection problem the in spite of taking sildenafil .  He wanted to ask for other option and I will refer him to see urologist.

## 2023-09-30 NOTE — Assessment & Plan Note (Signed)
 I will do PSA for prostate cancer screening.

## 2023-10-11 ENCOUNTER — Other Ambulatory Visit: Payer: Self-pay | Admitting: Internal Medicine

## 2023-10-11 MED ORDER — POTASSIUM CHLORIDE ER 20 MEQ PO TBCR: 20.0000 meq | EXTENDED_RELEASE_TABLET | Freq: Two times a day (BID) | ORAL | 6 refills | Status: AC

## 2023-10-25 ENCOUNTER — Other Ambulatory Visit: Payer: Self-pay | Admitting: Internal Medicine

## 2023-11-16 ENCOUNTER — Encounter: Payer: Self-pay | Admitting: Internal Medicine

## 2023-11-26 DIAGNOSIS — N5201 Erectile dysfunction due to arterial insufficiency: Secondary | ICD-10-CM | POA: Diagnosis not present

## 2023-11-26 DIAGNOSIS — R3912 Poor urinary stream: Secondary | ICD-10-CM | POA: Diagnosis not present

## 2023-12-12 ENCOUNTER — Other Ambulatory Visit: Payer: Self-pay | Admitting: Internal Medicine

## 2023-12-16 ENCOUNTER — Other Ambulatory Visit: Payer: Self-pay | Admitting: Internal Medicine

## 2023-12-21 ENCOUNTER — Ambulatory Visit: Admitting: Internal Medicine

## 2024-01-28 ENCOUNTER — Other Ambulatory Visit: Payer: Self-pay | Admitting: Internal Medicine

## 2024-02-05 ENCOUNTER — Ambulatory Visit: Admitting: Gastroenterology

## 2024-02-05 VITALS — BP 140/78 | HR 94 | Ht 74.0 in | Wt 262.5 lb

## 2024-02-05 DIAGNOSIS — K5909 Other constipation: Secondary | ICD-10-CM | POA: Diagnosis not present

## 2024-02-05 DIAGNOSIS — Z1211 Encounter for screening for malignant neoplasm of colon: Secondary | ICD-10-CM

## 2024-02-05 MED ORDER — NA SULFATE-K SULFATE-MG SULF 17.5-3.13-1.6 GM/177ML PO SOLN: 1.0000 | Freq: Once | ORAL | 0 refills | Status: AC

## 2024-02-05 NOTE — Patient Instructions (Addendum)
 _______________________________________________________  If your blood pressure at your visit was 140/90 or greater, please contact your primary care physician to follow up on this.  _______________________________________________________  If you are age 60 or older, your body mass index should be between 23-30. Your Body mass index is 33.7 kg/m. If this is out of the aforementioned range listed, please consider follow up with your Primary Care Provider.  If you are age 73 or younger, your body mass index should be between 19-25. Your Body mass index is 33.7 kg/m. If this is out of the aformentioned range listed, please consider follow up with your Primary Care Provider.   ________________________________________________________  The [facility] providers would like to encourage you to use MYCHART to communicate with providers for non-urgent requests or questions.  Due to long hold times on the telephone, sending your provider a message by [facility] may be a faster and more efficient way to get a response.  Please allow 48 business hours for a response.  Please remember that this is for non-urgent requests.  _______________________________________________________  [facility] is using a team-based approach to care.  Your team is made up of your doctor and two to three APPS. Our APPS (Nurse Practitioners and Physician Assistants) work with your physician to ensure care continuity for you. They are fully qualified to address your health concerns and develop a treatment plan. They communicate directly with your gastroenterologist to care for you. Seeing the Advanced Practice Practitioners on your physician's team can help you by facilitating care more promptly, often allowing for earlier appointments, access to diagnostic testing, procedures, and other specialty referrals.   Please purchase the following medications over the counter and take as directed: Miralax  17g daily in 8oz of  water  We have sent the following medications to your pharmacy for you to pick up at your convenience: Suprep  Two days before your procedure: Mix 3 packs (or capfuls) of Miralax  in 48 ounces of clear liquid and drink at 6pm.  You have been scheduled for a colonoscopy. Please follow written instructions given to you at your visit today.   If you use inhalers (even only as needed), please bring them with you on the day of your procedure.  DO NOT TAKE 7 DAYS PRIOR TO TEST- Trulicity (dulaglutide) Ozempic, Wegovy (semaglutide) Mounjaro, Zepbound (tirzepatide) Bydureon Bcise (exanatide extended release)  DO NOT TAKE 1 DAY PRIOR TO YOUR TEST Rybelsus (semaglutide) Adlyxin (lixisenatide) Victoza (liraglutide) Byetta (exanatide) ___________________________________________________________________________  Thank you,  Dr. Lynnie Bring
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
## 2024-03-18 ENCOUNTER — Other Ambulatory Visit: Payer: Self-pay | Admitting: Internal Medicine

## 2024-04-02 ENCOUNTER — Encounter: Payer: Self-pay | Admitting: Gastroenterology

## 2024-04-02 ENCOUNTER — Ambulatory Visit: Admitting: Gastroenterology

## 2024-04-02 VITALS — BP 139/77 | HR 71 | Temp 97.6°F | Resp 26 | Ht 74.0 in | Wt 262.0 lb

## 2024-04-02 DIAGNOSIS — K635 Polyp of colon: Secondary | ICD-10-CM | POA: Diagnosis not present

## 2024-04-02 DIAGNOSIS — K562 Volvulus: Secondary | ICD-10-CM | POA: Diagnosis not present

## 2024-04-02 DIAGNOSIS — Z1211 Encounter for screening for malignant neoplasm of colon: Secondary | ICD-10-CM | POA: Diagnosis not present

## 2024-04-02 DIAGNOSIS — K5730 Diverticulosis of large intestine without perforation or abscess without bleeding: Secondary | ICD-10-CM

## 2024-04-02 DIAGNOSIS — D122 Benign neoplasm of ascending colon: Secondary | ICD-10-CM

## 2024-04-02 DIAGNOSIS — Z1212 Encounter for screening for malignant neoplasm of rectum: Secondary | ICD-10-CM

## 2024-04-02 MED ORDER — SODIUM CHLORIDE 0.9 % IV SOLN: 500.0000 mL | Freq: Once | INTRAVENOUS | Status: DC

## 2024-04-02 NOTE — Patient Instructions (Signed)
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
## 2024-04-02 NOTE — Op Note (Signed)
 [facility] Patient Name: Myran Arcia Procedure Date: 04/02/2024 1:57 PM MRN: [id number] Endoscopist: Glendia E. Stacia , MD, [id number] Age: 59 Referring MD:  Date of Birth: 1966-01-20 Gender: Male Account #: [id number] Procedure:                Colonoscopy Indications:              Screening for colorectal malignant neoplasm, This                            is the patient's first colonoscopy Medicines:                Monitored Anesthesia Care Procedure:                Pre-Anesthesia Assessment:                           - Prior to the procedure, a History and Physical                            was performed, and patient medications and                            allergies were reviewed. The patient's tolerance of                            previous anesthesia was also reviewed. The risks                            and benefits of the procedure and the sedation                            options and risks were discussed with the patient.                            All questions were answered, and informed consent                            was obtained. Prior Anticoagulants: The patient has                            taken no anticoagulant or antiplatelet agents. ASA                            Grade Assessment: III - A patient with severe                            systemic disease. After reviewing the risks and                            benefits, the patient was deemed in satisfactory                            condition to undergo the procedure.  After obtaining informed consent, the colonoscope                            was passed under direct vision. Throughout the                            procedure, the patient's blood pressure, pulse, and                            oxygen saturations were monitored continuously. The                            Olympus Scope [id number] was introduced through the                            anus and  advanced to the the cecum, identified by                            appendiceal orifice and ileocecal valve. The                            colonoscopy was somewhat difficult due to a                            redundant colon and significant looping. Successful                            completion of the procedure was aided by using                            manual pressure and withdrawing and reinserting the                            scope. The patient tolerated the procedure well.                            The quality of the bowel preparation was adequate.                            The ileocecal valve, appendiceal orifice, and                            rectum were photographed. The bowel preparation                            used was Miralax  and SUPREP via extended prep with                            split dose instruction. Scope In: 2:14:50 PM Scope Out: 2:40:33 PM Scope Withdrawal Time: 0 hours 16 minutes 7 seconds  Total Procedure Duration: 0 hours 25 minutes 43 seconds  Findings:                 The perianal and digital rectal examinations were  normal. Pertinent negatives include normal                            sphincter tone and no palpable rectal lesions.                           A 3 mm polyp was found in the proximal ascending                            colon. The polyp was sessile. The polyp was removed                            with a cold snare. Resection and retrieval were                            complete. Estimated blood loss was minimal.                           A few large-mouthed diverticula were found in the                            ascending colon.                           The exam was otherwise normal throughout the                            examined colon.                           The retroflexed view of the distal rectum and anal                            verge was normal and showed no anal or rectal                             abnormalities. Complications:            No immediate complications. Estimated Blood Loss:     Estimated blood loss was minimal. Impression:               - One 3 mm polyp in the proximal ascending colon,                            removed with a cold snare. Resected and retrieved.                           - Mild diverticulosis in the ascending colon.                           - The distal rectum and anal verge are normal on                            retroflexion view. Recommendation:           -  Patient has a contact number available for                            emergencies. The signs and symptoms of potential                            delayed complications were discussed with the                            patient. Return to normal activities tomorrow.                            Written discharge instructions were provided to the                            patient.                           - Resume previous diet.                           - Continue present medications.                           - Await pathology results.                           - Repeat colonoscopy (date not yet determined) for                            surveillance based on pathology results. Mischa Pollard E. Stacia, MD 04/02/2024 2:48:00 PM This report has been signed electronically.

## 2024-04-02 NOTE — Progress Notes (Signed)
 Transferred to PACU via stretcher.  Not responding to stimulation at this time.  VSS upon leaving procedure room.

## 2024-04-02 NOTE — Progress Notes (Signed)
 Called to room to assist during endoscopic procedure.  Patient ID and intended procedure confirmed with present staff. Received instructions for my participation in the procedure from the performing physician.

## 2024-04-03 ENCOUNTER — Telehealth: Payer: Self-pay

## 2024-04-03 NOTE — Telephone Encounter (Signed)
 No answer after follow up call. Voice message left.

## 2024-04-08 ENCOUNTER — Ambulatory Visit: Payer: Self-pay | Admitting: Gastroenterology

## 2024-04-08 ENCOUNTER — Telehealth: Payer: Self-pay | Admitting: Neurology

## 2024-04-08 LAB — SURGICAL PATHOLOGY

## 2024-04-09 ENCOUNTER — Other Ambulatory Visit: Payer: Self-pay | Admitting: Internal Medicine
